# Patient Record
Sex: Male | Born: 1950 | Race: Black or African American | Hispanic: No | Marital: Single | Smoking: Former smoker
Health system: Southern US, Community
[De-identification: ages and names within clinical notes are randomized; demographics above are authoritative.]

## PROBLEM LIST (undated history)

## (undated) DIAGNOSIS — E785 Hyperlipidemia, unspecified: Secondary | ICD-10-CM

## (undated) DIAGNOSIS — N529 Male erectile dysfunction, unspecified: Secondary | ICD-10-CM

## (undated) DIAGNOSIS — N486 Induration penis plastica: Secondary | ICD-10-CM

## (undated) DIAGNOSIS — M25561 Pain in right knee: Secondary | ICD-10-CM

## (undated) DIAGNOSIS — I10 Essential (primary) hypertension: Secondary | ICD-10-CM

## (undated) HISTORY — DX: Male erectile dysfunction, unspecified: N52.9

## (undated) HISTORY — DX: Hyperlipidemia, unspecified: E78.5

## (undated) HISTORY — DX: Pain in right knee: M25.561

## (undated) HISTORY — PX: APPENDECTOMY: SHX54

## (undated) HISTORY — DX: Essential (primary) hypertension: I10

## (undated) HISTORY — DX: Induration penis plastica: N48.6

---

## 2018-01-16 DIAGNOSIS — E7849 Other hyperlipidemia: Secondary | ICD-10-CM | POA: Diagnosis not present

## 2018-01-16 DIAGNOSIS — Z6834 Body mass index (BMI) 34.0-34.9, adult: Secondary | ICD-10-CM | POA: Diagnosis not present

## 2018-01-16 DIAGNOSIS — I10 Essential (primary) hypertension: Secondary | ICD-10-CM | POA: Diagnosis not present

## 2018-07-19 DIAGNOSIS — R82998 Other abnormal findings in urine: Secondary | ICD-10-CM | POA: Diagnosis not present

## 2018-07-19 DIAGNOSIS — I10 Essential (primary) hypertension: Secondary | ICD-10-CM | POA: Diagnosis not present

## 2018-07-19 DIAGNOSIS — Z125 Encounter for screening for malignant neoplasm of prostate: Secondary | ICD-10-CM | POA: Diagnosis not present

## 2018-07-19 DIAGNOSIS — E7849 Other hyperlipidemia: Secondary | ICD-10-CM | POA: Diagnosis not present

## 2018-07-25 DIAGNOSIS — M25561 Pain in right knee: Secondary | ICD-10-CM | POA: Diagnosis not present

## 2018-07-25 DIAGNOSIS — E7849 Other hyperlipidemia: Secondary | ICD-10-CM | POA: Diagnosis not present

## 2018-07-25 DIAGNOSIS — I10 Essential (primary) hypertension: Secondary | ICD-10-CM | POA: Diagnosis not present

## 2018-07-25 DIAGNOSIS — N528 Other male erectile dysfunction: Secondary | ICD-10-CM | POA: Diagnosis not present

## 2018-07-25 DIAGNOSIS — Z0000 Encounter for general adult medical examination without abnormal findings: Secondary | ICD-10-CM | POA: Diagnosis not present

## 2018-07-25 DIAGNOSIS — N486 Induration penis plastica: Secondary | ICD-10-CM | POA: Diagnosis not present

## 2018-07-25 DIAGNOSIS — Z6833 Body mass index (BMI) 33.0-33.9, adult: Secondary | ICD-10-CM | POA: Diagnosis not present

## 2018-07-28 DIAGNOSIS — Z1212 Encounter for screening for malignant neoplasm of rectum: Secondary | ICD-10-CM | POA: Diagnosis not present

## 2018-10-19 DIAGNOSIS — Z6833 Body mass index (BMI) 33.0-33.9, adult: Secondary | ICD-10-CM | POA: Diagnosis not present

## 2018-10-19 DIAGNOSIS — M25461 Effusion, right knee: Secondary | ICD-10-CM | POA: Diagnosis not present

## 2018-10-19 DIAGNOSIS — M25561 Pain in right knee: Secondary | ICD-10-CM | POA: Diagnosis not present

## 2018-10-31 DIAGNOSIS — N5201 Erectile dysfunction due to arterial insufficiency: Secondary | ICD-10-CM | POA: Diagnosis not present

## 2018-10-31 DIAGNOSIS — N486 Induration penis plastica: Secondary | ICD-10-CM | POA: Diagnosis not present

## 2018-11-03 ENCOUNTER — Ambulatory Visit (AMBULATORY_SURGERY_CENTER): Payer: Self-pay

## 2018-11-03 VITALS — Ht 75.0 in | Wt 269.4 lb

## 2018-11-03 DIAGNOSIS — Z1211 Encounter for screening for malignant neoplasm of colon: Secondary | ICD-10-CM

## 2018-11-03 MED ORDER — NA SULFATE-K SULFATE-MG SULF 17.5-3.13-1.6 GM/177ML PO SOLN: 1.0000 | Freq: Once | ORAL | 0 refills | Status: AC

## 2018-11-03 NOTE — Progress Notes (Signed)
Per pt, no allergies to soy or egg products.Pt not taking any weight loss meds or using  O2 at home.  Pt refused emmi video. 

## 2018-11-06 ENCOUNTER — Encounter: Payer: Self-pay | Admitting: Gastroenterology

## 2018-11-13 ENCOUNTER — Ambulatory Visit (AMBULATORY_SURGERY_CENTER): Payer: Medicare HMO | Admitting: Gastroenterology

## 2018-11-13 ENCOUNTER — Encounter: Payer: Self-pay | Admitting: Gastroenterology

## 2018-11-13 VITALS — BP 122/78 | HR 65 | Temp 98.0°F | Resp 36 | Ht 75.0 in | Wt 269.0 lb

## 2018-11-13 DIAGNOSIS — Z1211 Encounter for screening for malignant neoplasm of colon: Secondary | ICD-10-CM | POA: Diagnosis not present

## 2018-11-13 DIAGNOSIS — D123 Benign neoplasm of transverse colon: Secondary | ICD-10-CM | POA: Diagnosis not present

## 2018-11-13 MED ORDER — SODIUM CHLORIDE 0.9 % IV SOLN: 500.0000 mL | Freq: Once | INTRAVENOUS | Status: DC

## 2018-11-13 NOTE — Patient Instructions (Signed)
Handouts: Polyps  YOU HAD AN ENDOSCOPIC PROCEDURE TODAY AT THE [facility]:   Refer to the procedure report that was given to you for any specific questions about what was found during the examination.  If the procedure report does not answer your questions, please call your gastroenterologist to clarify.  If you requested that your care partner not be given the details of your procedure findings, then the procedure report has been included in a sealed envelope for you to review at your convenience later.  YOU SHOULD EXPECT: Some feelings of bloating in the abdomen. Passage of more gas than usual.  Walking can help get rid of the air that was put into your GI tract during the procedure and reduce the bloating. If you had a lower endoscopy (such as a colonoscopy or flexible sigmoidoscopy) you may notice spotting of blood in your stool or on the toilet paper. If you underwent a bowel prep for your procedure, you may not have a normal bowel movement for a few days.  Please Note:  You might notice some irritation and congestion in your nose or some drainage.  This is from the oxygen used during your procedure.  There is no need for concern and it should clear up in a day or so.  SYMPTOMS TO REPORT IMMEDIATELY:   Following lower endoscopy (colonoscopy or flexible sigmoidoscopy):  Excessive amounts of blood in the stool  Significant tenderness or worsening of abdominal pains  Swelling of the abdomen that is new, acute  Fever of 100F or higher  For urgent or emergent issues, a gastroenterologist can be reached at any hour by calling [phone].   DIET:  We do recommend a small meal at first, but then you may proceed to your regular diet.  Drink plenty of fluids but you should avoid alcoholic beverages for 24 hours.  ACTIVITY:  You should plan to take it easy for the rest of today and you should NOT DRIVE or use heavy machinery until tomorrow (because of the sedation medicines used  during the test).    FOLLOW UP: Our staff will call the number listed on your records the next business day following your procedure to check on you and address any questions or concerns that you may have regarding the information given to you following your procedure. If we do not reach you, we will leave a message.  However, if you are feeling well and you are not experiencing any problems, there is no need to return our call.  We will assume that you have returned to your regular daily activities without incident.  If any biopsies were taken you will be contacted by phone or by letter within the next 1-3 weeks.  Please call us at [phone] if you have not heard about the biopsies in 3 weeks.    SIGNATURES/CONFIDENTIALITY: You and/or your care partner have signed paperwork which will be entered into your electronic medical record.  These signatures attest to the fact that that the information above on your After Visit Summary has been reviewed and is understood.  Full responsibility of the confidentiality of this discharge information lies with you and/or your care-partner. 

## 2018-11-13 NOTE — Progress Notes (Signed)
I have reviewed the patient's medical history in detail and updated the computerized patient record.

## 2018-11-13 NOTE — Progress Notes (Signed)
A and O x3. Report to RN. Tolerated MAC anesthesia well.

## 2018-11-13 NOTE — Op Note (Signed)
[place] Patient Name: Jack Perry Procedure Date: 11/13/2018 11:13 AM MRN: [id number] Endoscopist: Mallie Mussel L. Loletha Carrow , MD Age: 68 Referring MD:  Date of Birth: [birth year] Gender: Male Account #: [id number] Procedure:                Colonoscopy Indications:              Screening for colorectal malignant neoplasm, This                            is the patient's first colonoscopy Medicines:                Monitored Anesthesia Care Procedure:                Pre-Anesthesia Assessment:                           - Prior to the procedure, a History and Physical                            was performed, and patient medications and                            allergies were reviewed. The patient's tolerance of                            previous anesthesia was also reviewed. The risks                            and benefits of the procedure and the sedation                            options and risks were discussed with the patient.                            All questions were answered, and informed consent                            was obtained. Prior Anticoagulants: The patient has                            taken no previous anticoagulant or antiplatelet                            agents. ASA Grade Assessment: II - A patient with                            mild systemic disease. After reviewing the risks                            and benefits, the patient was deemed in                            satisfactory condition to undergo the procedure.  After obtaining informed consent, the colonoscope                            was passed under direct vision. Throughout the                            procedure, the patient's blood pressure, pulse, and                            oxygen saturations were monitored continuously. The                            Model CF-HQ190L [id number] scope was introduced                            through the anus and  advanced to the the cecum,                            identified by appendiceal orifice and ileocecal                            valve. The colonoscopy was performed without                            difficulty. The patient tolerated the procedure                            well. The quality of the bowel preparation was                            excellent. The ileocecal valve, appendiceal                            orifice, and rectum were photographed. Scope In: 11:20:31 AM Scope Out: 11:36:37 AM Scope Withdrawal Time: 0 hours 11 minutes 6 seconds  Total Procedure Duration: 0 hours 16 minutes 6 seconds  Findings:                 The perianal and digital rectal examinations were                            normal.                           A 2 mm polyp was found in the mid transverse colon.                            The polyp was sessile. The polyp was removed with a                            cold biopsy forceps. Resection and retrieval were                            complete.  The exam was otherwise without abnormality on                            direct and retroflexion views. Complications:            No immediate complications. Estimated Blood Loss:     Estimated blood loss was minimal. Impression:               - One 2 mm polyp in the mid transverse colon,                            removed with a cold biopsy forceps. Resected and                            retrieved.                           - The examination was otherwise normal on direct                            and retroflexion views. Recommendation:           - Patient has a contact number available for                            emergencies. The signs and symptoms of potential                            delayed complications were discussed with the                            patient. Return to normal activities tomorrow.                            Written discharge instructions were provided  to the                            patient.                           - Resume previous diet.                           - Continue present medications.                           - Await pathology results.                           - Repeat colonoscopy is recommended for                            surveillance. The colonoscopy date will be                            determined after pathology results from today's  exam become available for review. Henry L. Loletha Carrow, MD 11/13/2018 11:40:20 AM This report has been signed electronically.

## 2018-11-14 ENCOUNTER — Telehealth: Payer: Self-pay | Admitting: *Deleted

## 2018-11-14 NOTE — Telephone Encounter (Signed)
No answer, left message to call if questions or concerns. 

## 2018-11-14 NOTE — Telephone Encounter (Signed)
  Follow up Call-  Call back number 11/13/2018  Post procedure Call Back phone  # [phone]  Permission to leave phone message Yes     Patient questions:  Message left to call us if necessary. Second call.

## 2018-11-16 ENCOUNTER — Encounter: Payer: Self-pay | Admitting: Gastroenterology

## 2018-12-18 DIAGNOSIS — M25561 Pain in right knee: Secondary | ICD-10-CM | POA: Diagnosis not present

## 2018-12-18 DIAGNOSIS — M1711 Unilateral primary osteoarthritis, right knee: Secondary | ICD-10-CM | POA: Diagnosis not present

## 2019-01-16 DIAGNOSIS — M170 Bilateral primary osteoarthritis of knee: Secondary | ICD-10-CM | POA: Diagnosis not present

## 2019-01-23 DIAGNOSIS — I10 Essential (primary) hypertension: Secondary | ICD-10-CM | POA: Diagnosis not present

## 2019-01-23 DIAGNOSIS — M25561 Pain in right knee: Secondary | ICD-10-CM | POA: Diagnosis not present

## 2019-01-23 DIAGNOSIS — R69 Illness, unspecified: Secondary | ICD-10-CM | POA: Diagnosis not present

## 2019-01-23 DIAGNOSIS — E785 Hyperlipidemia, unspecified: Secondary | ICD-10-CM | POA: Diagnosis not present

## 2019-01-23 DIAGNOSIS — Z1331 Encounter for screening for depression: Secondary | ICD-10-CM | POA: Diagnosis not present

## 2019-01-23 DIAGNOSIS — N529 Male erectile dysfunction, unspecified: Secondary | ICD-10-CM | POA: Diagnosis not present

## 2019-01-31 DIAGNOSIS — N5201 Erectile dysfunction due to arterial insufficiency: Secondary | ICD-10-CM | POA: Diagnosis not present

## 2019-02-08 ENCOUNTER — Observation Stay (HOSPITAL_COMMUNITY)
Admission: EM | Admit: 2019-02-08 | Discharge: 2019-02-10 | Disposition: A | Discharge status: Home / Self Care | Payer: Medicare HMO | Attending: Surgery | Admitting: Surgery

## 2019-02-08 ENCOUNTER — Encounter (HOSPITAL_COMMUNITY): Payer: Self-pay | Admitting: Emergency Medicine

## 2019-02-08 ENCOUNTER — Emergency Department (HOSPITAL_COMMUNITY): Payer: Medicare HMO

## 2019-02-08 ENCOUNTER — Other Ambulatory Visit: Payer: Self-pay

## 2019-02-08 DIAGNOSIS — K819 Cholecystitis, unspecified: Secondary | ICD-10-CM | POA: Diagnosis present

## 2019-02-08 DIAGNOSIS — D72820 Lymphocytosis (symptomatic): Secondary | ICD-10-CM | POA: Diagnosis not present

## 2019-02-08 DIAGNOSIS — R52 Pain, unspecified: Secondary | ICD-10-CM

## 2019-02-08 DIAGNOSIS — R69 Illness, unspecified: Secondary | ICD-10-CM | POA: Diagnosis not present

## 2019-02-08 DIAGNOSIS — K810 Acute cholecystitis: Secondary | ICD-10-CM | POA: Diagnosis not present

## 2019-02-08 DIAGNOSIS — K219 Gastro-esophageal reflux disease without esophagitis: Secondary | ICD-10-CM | POA: Insufficient documentation

## 2019-02-08 DIAGNOSIS — K8020 Calculus of gallbladder without cholecystitis without obstruction: Secondary | ICD-10-CM | POA: Diagnosis not present

## 2019-02-08 DIAGNOSIS — R109 Unspecified abdominal pain: Secondary | ICD-10-CM | POA: Diagnosis not present

## 2019-02-08 DIAGNOSIS — K8010 Calculus of gallbladder with chronic cholecystitis without obstruction: Principal | ICD-10-CM | POA: Insufficient documentation

## 2019-02-08 DIAGNOSIS — K829 Disease of gallbladder, unspecified: Secondary | ICD-10-CM | POA: Diagnosis not present

## 2019-02-08 DIAGNOSIS — Z419 Encounter for procedure for purposes other than remedying health state, unspecified: Secondary | ICD-10-CM

## 2019-02-08 LAB — CBC WITH DIFFERENTIAL/PLATELET
Abs Immature Granulocytes: 0.01 10*3/uL (ref 0.00–0.07)
Basophils Absolute: 0 10*3/uL (ref 0.0–0.1)
Basophils Relative: 0 %
Eosinophils Absolute: 0.1 10*3/uL (ref 0.0–0.5)
Eosinophils Relative: 1 %
HCT: 50.3 % (ref 39.0–52.0)
Hemoglobin: 16 g/dL (ref 13.0–17.0)
Immature Granulocytes: 0 %
Lymphocytes Relative: 66 %
Lymphs Abs: 7.2 10*3/uL — ABNORMAL HIGH (ref 0.7–4.0)
MCH: 30.3 pg (ref 26.0–34.0)
MCHC: 31.8 g/dL (ref 30.0–36.0)
MCV: 95.3 fL (ref 80.0–100.0)
Monocytes Absolute: 0.5 10*3/uL (ref 0.1–1.0)
Monocytes Relative: 5 %
Neutro Abs: 3 10*3/uL (ref 1.7–7.7)
Neutrophils Relative %: 28 %
Platelets: 172 10*3/uL (ref 150–400)
RBC: 5.28 MIL/uL (ref 4.22–5.81)
RDW: 13.3 % (ref 11.5–15.5)
WBC: 10.7 10*3/uL — ABNORMAL HIGH (ref 4.0–10.5)
nRBC: 0 % (ref 0.0–0.2)

## 2019-02-08 LAB — URINALYSIS, ROUTINE W REFLEX MICROSCOPIC
Bacteria, UA: NONE SEEN
Bilirubin Urine: NEGATIVE
Glucose, UA: NEGATIVE mg/dL
Ketones, ur: NEGATIVE mg/dL
Leukocytes,Ua: NEGATIVE
Nitrite: NEGATIVE
Protein, ur: NEGATIVE mg/dL
Specific Gravity, Urine: 1.017 (ref 1.005–1.030)
pH: 5 (ref 5.0–8.0)

## 2019-02-08 LAB — COMPREHENSIVE METABOLIC PANEL
ALT: 14 U/L (ref 0–44)
AST: 23 U/L (ref 15–41)
Albumin: 4.4 g/dL (ref 3.5–5.0)
Alkaline Phosphatase: 71 U/L (ref 38–126)
Anion gap: 9 (ref 5–15)
BUN: 16 mg/dL (ref 8–23)
CO2: 25 mmol/L (ref 22–32)
Calcium: 9.4 mg/dL (ref 8.9–10.3)
Chloride: 104 mmol/L (ref 98–111)
Creatinine, Ser: 0.94 mg/dL (ref 0.61–1.24)
GFR calc Af Amer: >60 mL/min (ref >60)
GFR calc non Af Amer: >60 mL/min (ref >60)
Glucose, Bld: 93 mg/dL (ref 70–99)
Potassium: 4.2 mmol/L (ref 3.5–5.1)
Sodium: 138 mmol/L (ref 135–145)
Total Bilirubin: 0.9 mg/dL (ref 0.3–1.2)
Total Protein: 7.7 g/dL (ref 6.5–8.1)

## 2019-02-08 LAB — LIPASE, BLOOD: Lipase: 28 U/L (ref 11–51)

## 2019-02-08 MED ORDER — FENTANYL CITRATE (PF) 100 MCG/2ML IJ SOLN
50.0000 ug | Freq: Once | INTRAMUSCULAR | Status: AC
  Administered 2019-02-08: 50 ug via INTRAVENOUS
  Filled 2019-02-08: qty 2

## 2019-02-08 MED ORDER — ENOXAPARIN SODIUM 40 MG/0.4ML ~~LOC~~ SOLN
40.0000 mg | Freq: Every day | SUBCUTANEOUS | Status: DC
  Administered 2019-02-09: 40 mg via SUBCUTANEOUS
  Filled 2019-02-08 (×2): qty 0.4

## 2019-02-08 MED ORDER — IOHEXOL 300 MG/ML  SOLN
100.0000 mL | Freq: Once | INTRAMUSCULAR | Status: AC | PRN
  Administered 2019-02-08: 100 mL via INTRAVENOUS

## 2019-02-08 MED ORDER — SODIUM CHLORIDE 0.9 % IV SOLN
INTRAVENOUS | Status: DC
  Administered 2019-02-08 – 2019-02-09 (×2): via INTRAVENOUS

## 2019-02-08 MED ORDER — SODIUM CHLORIDE (PF) 0.9 % IJ SOLN
INTRAMUSCULAR | Status: AC
  Filled 2019-02-08: qty 50

## 2019-02-08 MED ORDER — DIPHENHYDRAMINE HCL 12.5 MG/5ML PO ELIX: 12.5000 mg | ORAL_SOLUTION | Freq: Four times a day (QID) | ORAL | Status: DC | PRN

## 2019-02-08 MED ORDER — HYDROMORPHONE HCL 1 MG/ML IJ SOLN
0.5000 mg | INTRAMUSCULAR | Status: DC | PRN
  Administered 2019-02-08 – 2019-02-09 (×3): 0.5 mg via INTRAVENOUS
  Filled 2019-02-08 (×3): qty 0.5

## 2019-02-08 MED ORDER — SODIUM CHLORIDE 0.9 % IV SOLN
2.0000 g | Freq: Every day | INTRAVENOUS | Status: DC
  Administered 2019-02-09: 01:00:00 2 g via INTRAVENOUS
  Filled 2019-02-08: qty 2
  Filled 2019-02-08: qty 20

## 2019-02-08 MED ORDER — ONDANSETRON 4 MG PO TBDP: 4.0000 mg | ORAL_TABLET | Freq: Four times a day (QID) | ORAL | Status: DC | PRN

## 2019-02-08 MED ORDER — DOCUSATE SODIUM 100 MG PO CAPS
100.0000 mg | ORAL_CAPSULE | Freq: Two times a day (BID) | ORAL | Status: DC
  Administered 2019-02-09 – 2019-02-10 (×3): 100 mg via ORAL
  Filled 2019-02-08 (×4): qty 1

## 2019-02-08 MED ORDER — ONDANSETRON HCL 4 MG/2ML IJ SOLN: 4.0000 mg | Freq: Four times a day (QID) | INTRAMUSCULAR | Status: DC | PRN

## 2019-02-08 MED ORDER — DIPHENHYDRAMINE HCL 50 MG/ML IJ SOLN: 12.5000 mg | Freq: Four times a day (QID) | INTRAMUSCULAR | Status: DC | PRN

## 2019-02-08 MED ORDER — ONDANSETRON HCL 4 MG/2ML IJ SOLN
4.0000 mg | Freq: Once | INTRAMUSCULAR | Status: AC
  Administered 2019-02-08: 4 mg via INTRAVENOUS
  Filled 2019-02-08: qty 2

## 2019-02-08 NOTE — ED Provider Notes (Signed)
[facility] DEPT Provider Note   CSN: [id number] Arrival date & time: 02/08/19  1602    History   Chief Complaint Chief Complaint  Patient presents with   Abdominal Pain    HPI Jack Perry is a 68 y.o. male.     HPI Patient presents with concern of abdominal pain, anorexia, nausea. Onset was greater than 1 month ago, and symptoms have been persistent, with pain focally about the epigastrium, with some radiation throughout the infracostal region. Pain is sore, severe, currently 10/10. Patient had a colonoscopy about the time of onset of symptoms. He notes that since that time he has had inconsistent bowel movements as well as the aforementioned pain, nausea. He has spoken with his physician, started a new medication 4 days ago. He notes that in spite of his medication, which he is unaware of the name, he has had persistent pain, discomfort persistently. No fever, no chest pain, no dyspnea.  Patient states that he is generally well. History reviewed. No pertinent past medical history.  There are no active problems to display for this patient.   Past Surgical History:  Procedure Laterality Date   APPENDECTOMY  1970's        Home Medications    Prior to Admission medications   Not on File    Family History Family History  Problem Relation Age of Onset   Lung cancer Father    Healthy Sister    Heart disease Brother     Social History Social History   Tobacco Use   Smoking status: Never Smoker   Smokeless tobacco: Never Used  Substance Use Topics   Alcohol use: Not Currently   Drug use: Not Currently     Allergies   Patient has no known allergies.   Review of Systems Review of Systems  Constitutional:       Per HPI, otherwise negative  HENT:       Per HPI, otherwise negative  Respiratory:       Per HPI, otherwise negative  Cardiovascular:       Per HPI, otherwise negative  Gastrointestinal: Positive  for abdominal pain and nausea. Negative for vomiting.  Endocrine:       Negative aside from HPI  Genitourinary:       Neg aside from HPI   Musculoskeletal:       Per HPI, otherwise negative  Skin: Negative.   Neurological: Negative for syncope.     Physical Exam Updated Vital Signs BP (!) 174/95 (BP Location: Left Arm) Comment: Simultaneous filing. User may not have seen previous data.   Pulse 84 Comment: Simultaneous filing. User may not have seen previous data.   Temp 99.2 F (37.3 C) (Oral)    Resp 18    SpO2 99% Comment: Simultaneous filing. User may not have seen previous data.  Physical Exam Vitals signs and nursing note reviewed.  Constitutional:      General: He is not in acute distress.    Appearance: He is well-developed.  HENT:     Head: Normocephalic and atraumatic.  Eyes:     Conjunctiva/sclera: Conjunctivae normal.  Cardiovascular:     Rate and Rhythm: Normal rate and regular rhythm.  Pulmonary:     Effort: Pulmonary effort is normal. No respiratory distress.     Breath sounds: No stridor.  Abdominal:     General: There is no distension.     Tenderness: There is abdominal tenderness in the epigastric area.  Comments: Mild tenderness about the epigastrium, no peritoneal findings  Skin:    General: Skin is warm and dry.  Neurological:     Mental Status: He is alert and oriented to person, place, and time.      ED Treatments / Results  Labs (all labs ordered are listed, but only abnormal results are displayed) Labs Reviewed  CBC WITH DIFFERENTIAL/PLATELET - Abnormal; Notable for the following components:      Result Value   WBC 10.7 (*)    Lymphs Abs 7.2 (*)    All other components within normal limits  URINALYSIS, ROUTINE W REFLEX MICROSCOPIC - Abnormal; Notable for the following components:   Hgb urine dipstick SMALL (*)    All other components within normal limits  COMPREHENSIVE METABOLIC PANEL  LIPASE, BLOOD  PATHOLOGIST SMEAR REVIEW     EKG None  Radiology Ct Abdomen Pelvis W Contrast  Result Date: 02/08/2019 CLINICAL DATA:  Patient c/o constant sharp epigastric pain x1 month. Reports noticed pain started after colonoscopy. Denies N/V/D. States pain worsens with eating EXAM: CT ABDOMEN AND PELVIS WITH CONTRAST TECHNIQUE: Multidetector CT imaging of the abdomen and pelvis was performed using the standard protocol following bolus administration of intravenous contrast. CONTRAST:  147mL OMNIPAQUE IOHEXOL 300 MG/ML  SOLN COMPARISON:  None. FINDINGS: Lower chest: No acute abnormality. Hepatobiliary: No focal liver abnormality is seen. Distended gallbladder with gallbladder wall thickening and a small amount of pericholecystic fluid. Mild inflammatory changes around the gallbladder. Pancreas: Unremarkable. No pancreatic ductal dilatation or surrounding inflammatory changes. Spleen: Normal in size without focal abnormality. Adrenals/Urinary Tract: Adrenal glands are unremarkable. Kidneys are normal, without renal calculi, focal lesion, or hydronephrosis. Bladder is unremarkable. Stomach/Bowel: Stomach is within normal limits. Appendix appears normal. No evidence of bowel wall thickening, distention, or inflammatory changes. Vascular/Lymphatic: No significant vascular findings are present. No enlarged abdominal or pelvic lymph nodes. Reproductive: Prostate is unremarkable. Other: No abdominal wall hernia or abnormality. No abdominopelvic ascites. Musculoskeletal: No acute or significant osseous findings. 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 IMPRESSION: 1. Distended gallbladder with mild pericholecystic fluid, gallbladder wall thickening and mild surrounding inflammatory changes. Overall appearance is most concerning for acute cholecystitis. Recommend further evaluation with a right upper quadrant ultrasound for evaluation cholelithiasis. Electronically Signed   By: Kathreen Devoid   On: 02/08/2019  18:31   US Abdomen Limited Ruq  Result Date: 02/08/2019 CLINICAL DATA:  Right upper quadrant abdominal pain. Symptoms for 1 month. Abnormal appearance gallbladder on preceding CT. EXAM: ULTRASOUND ABDOMEN LIMITED RIGHT UPPER QUADRANT COMPARISON:  CT same date. FINDINGS: Gallbladder: There is curvilinear echogenicity in the gallbladder fundus, without corresponding calcification or air on preceding CT. Several gallstones are present as well as sludge. There is gallbladder wall thickening to 5 mm and mild pericholecystic fluid. Per the sonographer, there is a positive sonographic Murphy's sign. Common bile duct: Diameter: 6 mm Liver: No focal lesion identified. Within normal limits in parenchymal echogenicity. Portal vein is patent on color Doppler imaging with normal direction of blood flow towards the liver. IMPRESSION: 1. Cholelithiasis, gallbladder wall thickening and positive sonographic Murphy sign consistent with acute cholecystitis as correlated with preceding CT. 2. No biliary dilatation. Electronically Signed   By: Richardean Sale M.D.   On: 02/08/2019 20:06    Procedures Procedures (including critical care time)  Medications Ordered in ED Medications  0.9 %  sodium chloride infusion ( Intravenous New Bag/Given 02/08/19 1659)  sodium chloride (PF) 0.9 %  injection (has no administration in time range)  fentaNYL (SUBLIMAZE) injection 50 mcg (50 mcg Intravenous Given 02/08/19 1721)  ondansetron (ZOFRAN) injection 4 mg (4 mg Intravenous Given 02/08/19 1719)  iohexol (OMNIPAQUE) 300 MG/ML solution 100 mL (100 mLs Intravenous Contrast Given 02/08/19 1814)     Initial Impression / Assessment and Plan / ED Course  I have reviewed the triage vital signs and the nursing notes.  Pertinent labs & imaging results that were available during my care of the patient were reviewed by me and considered in my medical decision making (see chart for details).    Colonoscopy results from January, 2020  reviewed: Findings: - The perianal and digital rectal examinations were normal. - A 2 mm polyp was found in the mid transverse colon. The polyp was sessile. The polyp was removed with a cold biopsy forceps. Resection and retrieval were complete. - The exam was otherwise without abnormality on direct and retroflexion views.    Patient aware of all findings.   I discussed patient's case with our general surgeon, Dr. Marcello Moores. [place] requested  8:20 PM Patient aware of all findings, consistent with acute cholecystitis peer He is awake and alert, sitting upright, hemodynamically unremarkable Patient appearing for admission for surgical evaluation.  Final Clinical Impressions(s) / ED Diagnoses   Final diagnoses:  Pain  Acute cholecystitis      Carmin Muskrat, MD 02/08/19 2021

## 2019-02-08 NOTE — ED Notes (Signed)
Called to give report, nurse unable to take report at this time.

## 2019-02-08 NOTE — ED Notes (Signed)
ED TO INPATIENT HANDOFF REPORT  ED Nurse Name and Phone #: Earnest Bailey RN [phone]  S Name/Age/Gender [place] 68 y.o. male Room/Bed: WA13/WA13  Code Status   Code Status: Not on file  Home/SNF/Other Home Patient oriented to: self Is this baseline? Yes   Triage Complete: Triage complete  Chief Complaint abd pain  Triage Note Patient c/o constant sharp epigastric pain x1 month. Reports noticed pain started after colonoscopy. Denies N/V/D. States pain worsens with eating.   Allergies No Known Allergies  Level of Care/Admitting Diagnosis ED Disposition    ED Disposition Condition Comment   Admit  Hospital Area: [facility] [[id number]]  Level of Care: Med-Surg [16]  Covid Evaluation: N/A  Diagnosis: Cholecystitis [409735]  Admitting Physician: CCS, Oakes  Attending Physician: CCS, MD [[id number]  PT Class (Do Not Modify): Observation [104]  PT Acc Code (Do Not Modify): Observation [10022]       B Medical/Surgery History History reviewed. No pertinent past medical history. Past Surgical History:  Procedure Laterality Date  . APPENDECTOMY  1970's     A IV Location/Drains/Wounds Patient Lines/Drains/Airways Status   Active Line/Drains/Airways    Name:   Placement date:   Placement time:   Site:   Days:   Peripheral IV 02/08/19 Left Antecubital   02/08/19    1645    Antecubital   less than 1          Intake/Output Last 24 hours No intake or output data in the 24 hours ending 02/08/19 2311  Labs/Imaging Results for orders placed or performed during the hospital encounter of 02/08/19 (from the past 48 hour(s))  Comprehensive metabolic panel     Status: None   Collection Time: 02/08/19  4:55 PM  Result Value Ref Range   Sodium 138 135 - 145 mmol/L   Potassium 4.2 3.5 - 5.1 mmol/L   Chloride 104 98 - 111 mmol/L   CO2 25 22 - 32 mmol/L   Glucose, Bld 93 70 - 99 mg/dL   BUN 16 8 - 23 mg/dL   Creatinine, Ser 0.94 0.61 - 1.24 mg/dL   Calcium  9.4 8.9 - 10.3 mg/dL   Total Protein 7.7 6.5 - 8.1 g/dL   Albumin 4.4 3.5 - 5.0 g/dL   AST 23 15 - 41 U/L   ALT 14 0 - 44 U/L   Alkaline Phosphatase 71 38 - 126 U/L   Total Bilirubin 0.9 0.3 - 1.2 mg/dL   GFR calc non Af Amer >60 >60 mL/min   GFR calc Af Amer >60 >60 mL/min   Anion gap 9 5 - 15    Comment: Performed at [facility], [place] [address], [place], [place] [address]  Lipase, blood     Status: None   Collection Time: 02/08/19  4:55 PM  Result Value Ref Range   Lipase 28 11 - 51 U/L    Comment: Performed at [facility], [place] [address], [place], [place] [address]  CBC WITH DIFFERENTIAL     Status: Abnormal   Collection Time: 02/08/19  4:55 PM  Result Value Ref Range   WBC 10.7 (H) 4.0 - 10.5 K/uL   RBC 5.28 4.22 - 5.81 MIL/uL   Hemoglobin 16.0 13.0 - 17.0 g/dL   HCT 50.3 39.0 - 52.0 %   MCV 95.3 80.0 - 100.0 fL   MCH 30.3 26.0 - 34.0 pg   MCHC 31.8 30.0 - 36.0 g/dL   RDW 13.3 11.5 -  15.5 %   Platelets 172 150 - 400 K/uL   nRBC 0.0 0.0 - 0.2 %   Neutrophils Relative % 28 %   Neutro Abs 3.0 1.7 - 7.7 K/uL   Lymphocytes Relative 66 %   Lymphs Abs 7.2 (H) 0.7 - 4.0 K/uL   Monocytes Relative 5 %   Monocytes Absolute 0.5 0.1 - 1.0 K/uL   Eosinophils Relative 1 %   Eosinophils Absolute 0.1 0.0 - 0.5 K/uL   Basophils Relative 0 %   Basophils Absolute 0.0 0.0 - 0.1 K/uL   WBC Morphology ABSOLUTE LYMPHOCYTOSIS     Comment: PENDING PATH REVIEW   Immature Granulocytes 0 %   Abs Immature Granulocytes 0.01 0.00 - 0.07 K/uL   Abnormal Lymphocytes Present PRESENT     Comment: Performed at [facility], [place] [address], [place], [place] [address]  Urinalysis, Routine w reflex microscopic     Status: Abnormal   Collection Time: 02/08/19  4:55 PM  Result Value Ref Range   Color, Urine YELLOW YELLOW   APPearance CLEAR CLEAR   Specific Gravity, Urine 1.017 1.005 - 1.030   pH 5.0 5.0 - 8.0   Glucose, UA NEGATIVE NEGATIVE  mg/dL   Hgb urine dipstick SMALL (A) NEGATIVE   Bilirubin Urine NEGATIVE NEGATIVE   Ketones, ur NEGATIVE NEGATIVE mg/dL   Protein, ur NEGATIVE NEGATIVE mg/dL   Nitrite NEGATIVE NEGATIVE   Leukocytes,Ua NEGATIVE NEGATIVE   RBC / HPF 0-5 0 - 5 RBC/hpf   WBC, UA 0-5 0 - 5 WBC/hpf   Bacteria, UA NONE SEEN NONE SEEN   Squamous Epithelial / LPF 0-5 0 - 5   Mucus PRESENT     Comment: Performed at [facility], [place] [address] [place], [place] [address]   Ct Abdomen Pelvis W Contrast  Result Date: 02/08/2019 CLINICAL DATA:  Patient c/o constant sharp epigastric pain x1 month. Reports noticed pain started after colonoscopy. Denies N/V/D. States pain worsens with eating EXAM: CT ABDOMEN AND PELVIS WITH CONTRAST TECHNIQUE: Multidetector CT imaging of the abdomen and pelvis was performed using the standard protocol following bolus administration of intravenous contrast. CONTRAST:  170mL OMNIPAQUE IOHEXOL 300 MG/ML  SOLN COMPARISON:  None. FINDINGS: Lower chest: No acute abnormality. Hepatobiliary: No focal liver abnormality is seen. Distended gallbladder with gallbladder wall thickening and a small amount of pericholecystic fluid. Mild inflammatory changes around the gallbladder. Pancreas: Unremarkable. No pancreatic ductal dilatation or surrounding inflammatory changes. Spleen: Normal in size without focal abnormality. Adrenals/Urinary Tract: Adrenal glands are unremarkable. Kidneys are normal, without renal calculi, focal lesion, or hydronephrosis. Bladder is unremarkable. Stomach/Bowel: Stomach is within normal limits. Appendix appears normal. No evidence of bowel wall thickening, distention, or inflammatory changes. Vascular/Lymphatic: No significant vascular findings are present. No enlarged abdominal or pelvic lymph nodes. Reproductive: Prostate is unremarkable. Other: No abdominal wall hernia or abnormality. No abdominopelvic ascites. Musculoskeletal: No acute or significant osseous  findings. 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 IMPRESSION: 1. Distended gallbladder with mild pericholecystic fluid, gallbladder wall thickening and mild surrounding inflammatory changes. Overall appearance is most concerning for acute cholecystitis. Recommend further evaluation with a right upper quadrant ultrasound for evaluation cholelithiasis. Electronically Signed   By: Kathreen Devoid   On: 02/08/2019 18:31   US Abdomen Limited Ruq  Result Date: 02/08/2019 CLINICAL DATA:  Right upper quadrant abdominal pain. Symptoms for 1 month. Abnormal appearance gallbladder on preceding CT. EXAM: ULTRASOUND ABDOMEN LIMITED RIGHT UPPER QUADRANT  COMPARISON:  CT same date. FINDINGS: Gallbladder: There is curvilinear echogenicity in the gallbladder fundus, without corresponding calcification or air on preceding CT. Several gallstones are present as well as sludge. There is gallbladder wall thickening to 5 mm and mild pericholecystic fluid. Per the sonographer, there is a positive sonographic Murphy's sign. Common bile duct: Diameter: 6 mm Liver: No focal lesion identified. Within normal limits in parenchymal echogenicity. Portal vein is patent on color Doppler imaging with normal direction of blood flow towards the liver. IMPRESSION: 1. Cholelithiasis, gallbladder wall thickening and positive sonographic Murphy sign consistent with acute cholecystitis as correlated with preceding CT. 2. No biliary dilatation. Electronically Signed   By: Richardean Sale M.D.   On: 02/08/2019 20:06    Pending Labs Unresulted Labs (From admission, onward)    Start     Ordered   02/08/19 1655  Pathologist smear review  Once,   R     02/08/19 1655   Signed and Held  HIV antibody (Routine Testing)  Once,   R     Signed and Held          Vitals/Pain Today's Vitals   02/08/19 1830 02/08/19 1930 02/08/19 2030 02/08/19 2302  BP: (!) 158/86 (!) 174/95 (!) 161/100 (!) 177/92  Pulse: 61  84 60 (!) 58  Resp:  18 16 17   Temp:      TempSrc:      SpO2: 100% 99% 98% 99%  PainSc:        Isolation Precautions No active isolations  Medications Medications  0.9 %  sodium chloride infusion ( Intravenous New Bag/Given 02/08/19 1659)  sodium chloride (PF) 0.9 % injection (has no administration in time range)  fentaNYL (SUBLIMAZE) injection 50 mcg (50 mcg Intravenous Given 02/08/19 1721)  ondansetron (ZOFRAN) injection 4 mg (4 mg Intravenous Given 02/08/19 1719)  iohexol (OMNIPAQUE) 300 MG/ML solution 100 mL (100 mLs Intravenous Contrast Given 02/08/19 1814)    Mobility walks Low fall risk   Focused Assessments      R Recommendations: See Admitting Provider Note  Report given to:   Additional Notes:

## 2019-02-08 NOTE — ED Notes (Signed)
Patient transported to CT 

## 2019-02-08 NOTE — Progress Notes (Signed)
Called to receive report for RN admitting pt. No answer after being on hold.

## 2019-02-08 NOTE — ED Triage Notes (Addendum)
Patient c/o constant sharp epigastric pain x1 month. Reports noticed pain started after colonoscopy. Denies N/V/D. States pain worsens with eating.

## 2019-02-08 NOTE — H&P (Signed)
Jack Perry is an 68 y.o. male.   Chief Complaint: epigastric pain HPI: 68 y.o. M who presents to the Ed with sharp epigastric pain after eating for the last 6 weeks.  States that this current episode did not resolve on its own.  Denies nausea or chest pain.  History reviewed. No pertinent past medical history.  Past Surgical History:  Procedure Laterality Date  . APPENDECTOMY  1970's     Family History  Problem Relation Age of Onset  . Lung cancer Father   . Healthy Sister   . Heart disease Brother    Social History:  reports that he has never smoked. He has never used smokeless tobacco. He reports previous alcohol use. He reports previous drug use.  Allergies: No Known Allergies  (Not in a hospital admission)   Results for orders placed or performed during the hospital encounter of 02/08/19 (from the past 48 hour(s))  Comprehensive metabolic panel     Status: None   Collection Time: 02/08/19  4:55 PM  Result Value Ref Range   Sodium 138 135 - 145 mmol/L   Potassium 4.2 3.5 - 5.1 mmol/L   Chloride 104 98 - 111 mmol/L   CO2 25 22 - 32 mmol/L   Glucose, Bld 93 70 - 99 mg/dL   BUN 16 8 - 23 mg/dL   Creatinine, Ser 0.94 0.61 - 1.24 mg/dL   Calcium 9.4 8.9 - 10.3 mg/dL   Total Protein 7.7 6.5 - 8.1 g/dL   Albumin 4.4 3.5 - 5.0 g/dL   AST 23 15 - 41 U/L   ALT 14 0 - 44 U/L   Alkaline Phosphatase 71 38 - 126 U/L   Total Bilirubin 0.9 0.3 - 1.2 mg/dL   GFR calc non Af Amer >60 >60 mL/min   GFR calc Af Amer >60 >60 mL/min   Anion gap 9 5 - 15    Comment: Performed at [facility], [place] [address], [place], [place] [address]  Lipase, blood     Status: None   Collection Time: 02/08/19  4:55 PM  Result Value Ref Range   Lipase 28 11 - 51 U/L    Comment: Performed at [facility], [place] [address], [place], [place] [address]  CBC WITH DIFFERENTIAL     Status: Abnormal   Collection Time: 02/08/19  4:55 PM  Result Value Ref Range   WBC  10.7 (H) 4.0 - 10.5 K/uL   RBC 5.28 4.22 - 5.81 MIL/uL   Hemoglobin 16.0 13.0 - 17.0 g/dL   HCT 50.3 39.0 - 52.0 %   MCV 95.3 80.0 - 100.0 fL   MCH 30.3 26.0 - 34.0 pg   MCHC 31.8 30.0 - 36.0 g/dL   RDW 13.3 11.5 - 15.5 %   Platelets 172 150 - 400 K/uL   nRBC 0.0 0.0 - 0.2 %   Neutrophils Relative % 28 %   Neutro Abs 3.0 1.7 - 7.7 K/uL   Lymphocytes Relative 66 %   Lymphs Abs 7.2 (H) 0.7 - 4.0 K/uL   Monocytes Relative 5 %   Monocytes Absolute 0.5 0.1 - 1.0 K/uL   Eosinophils Relative 1 %   Eosinophils Absolute 0.1 0.0 - 0.5 K/uL   Basophils Relative 0 %   Basophils Absolute 0.0 0.0 - 0.1 K/uL   WBC Morphology ABSOLUTE LYMPHOCYTOSIS     Comment: PENDING PATH REVIEW   Immature Granulocytes 0 %   Abs Immature Granulocytes 0.01 0.00 - 0.07  K/uL   Abnormal Lymphocytes Present PRESENT     Comment: Performed at [facility], [place] [address], [place], [place] [address]  Urinalysis, Routine w reflex microscopic     Status: Abnormal   Collection Time: 02/08/19  4:55 PM  Result Value Ref Range   Color, Urine YELLOW YELLOW   APPearance CLEAR CLEAR   Specific Gravity, Urine 1.017 1.005 - 1.030   pH 5.0 5.0 - 8.0   Glucose, UA NEGATIVE NEGATIVE mg/dL   Hgb urine dipstick SMALL (A) NEGATIVE   Bilirubin Urine NEGATIVE NEGATIVE   Ketones, ur NEGATIVE NEGATIVE mg/dL   Protein, ur NEGATIVE NEGATIVE mg/dL   Nitrite NEGATIVE NEGATIVE   Leukocytes,Ua NEGATIVE NEGATIVE   RBC / HPF 0-5 0 - 5 RBC/hpf   WBC, UA 0-5 0 - 5 WBC/hpf   Bacteria, UA NONE SEEN NONE SEEN   Squamous Epithelial / LPF 0-5 0 - 5   Mucus PRESENT     Comment: Performed at [facility], [place] [address] [place], [place] [address]   Ct Abdomen Pelvis W Contrast  Result Date: 02/08/2019 CLINICAL DATA:  Patient c/o constant sharp epigastric pain x1 month. Reports noticed pain started after colonoscopy. Denies N/V/D. States pain worsens with eating EXAM: CT ABDOMEN AND PELVIS WITH CONTRAST  TECHNIQUE: Multidetector CT imaging of the abdomen and pelvis was performed using the standard protocol following bolus administration of intravenous contrast. CONTRAST:  140mL OMNIPAQUE IOHEXOL 300 MG/ML  SOLN COMPARISON:  None. FINDINGS: Lower chest: No acute abnormality. Hepatobiliary: No focal liver abnormality is seen. Distended gallbladder with gallbladder wall thickening and a small amount of pericholecystic fluid. Mild inflammatory changes around the gallbladder. Pancreas: Unremarkable. No pancreatic ductal dilatation or surrounding inflammatory changes. Spleen: Normal in size without focal abnormality. Adrenals/Urinary Tract: Adrenal glands are unremarkable. Kidneys are normal, without renal calculi, focal lesion, or hydronephrosis. Bladder is unremarkable. Stomach/Bowel: Stomach is within normal limits. Appendix appears normal. No evidence of bowel wall thickening, distention, or inflammatory changes. Vascular/Lymphatic: No significant vascular findings are present. No enlarged abdominal or pelvic lymph nodes. Reproductive: Prostate is unremarkable. Other: No abdominal wall hernia or abnormality. No abdominopelvic ascites. Musculoskeletal: No acute or significant osseous findings. 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 IMPRESSION: 1. Distended gallbladder with mild pericholecystic fluid, gallbladder wall thickening and mild surrounding inflammatory changes. Overall appearance is most concerning for acute cholecystitis. Recommend further evaluation with a right upper quadrant ultrasound for evaluation cholelithiasis. Electronically Signed   By: Kathreen Devoid   On: 02/08/2019 18:31    Review of Systems  Constitutional: Negative for chills and fever.  HENT: Negative for congestion and hearing loss.   Respiratory: Negative for cough and shortness of breath.   Cardiovascular: Negative for chest pain and palpitations.  Gastrointestinal: Positive for  abdominal pain. Negative for nausea and vomiting.  Genitourinary: Negative for dysuria and urgency.  Musculoskeletal: Negative for myalgias.  Neurological: Negative for dizziness and headaches.    Blood pressure (!) 158/86, pulse 61, temperature 99.2 F (37.3 C), temperature source Oral, resp. rate 18, SpO2 100 %. Physical Exam  Constitutional: He is oriented to person, place, and time. He appears well-developed and well-nourished.  HENT:  Head: Normocephalic and atraumatic.  Eyes: Pupils are equal, round, and reactive to light. Conjunctivae and EOM are normal.  Neck: Neck supple.  Cardiovascular: Normal rate and regular rhythm.  Respiratory: Effort normal. No respiratory distress.  GI: Soft. There is abdominal tenderness.  Musculoskeletal:  Normal range of motion.  Neurological: He is alert and oriented to person, place, and time.  Skin: Skin is warm and dry.     Assessment/Plan 68 y.o. M with epigastric pain,relatively  normal LFTs and wbc.  CT shows distended GB.  US shows stones and thickened GB wall.  Pt appears to have cholecystitis.  Will admit and place on IV abx.  Plan for OR tom.    Rosario Adie, MD 0/30/1499, 7:13 PM

## 2019-02-08 NOTE — ED Notes (Signed)
Ultrasound at bedside

## 2019-02-08 NOTE — ED Notes (Signed)
Hospitalist called to make aware of continued hypertension. Pt denies any history.

## 2019-02-09 ENCOUNTER — Observation Stay (HOSPITAL_COMMUNITY): Payer: Medicare HMO

## 2019-02-09 ENCOUNTER — Observation Stay (HOSPITAL_COMMUNITY): Payer: Medicare HMO | Admitting: Anesthesiology

## 2019-02-09 ENCOUNTER — Other Ambulatory Visit: Payer: Self-pay

## 2019-02-09 ENCOUNTER — Encounter (HOSPITAL_COMMUNITY)
Admission: EM | Disposition: A | Discharge status: Home / Self Care | Payer: Self-pay | Source: Home / Self Care | Attending: Emergency Medicine

## 2019-02-09 ENCOUNTER — Encounter (HOSPITAL_COMMUNITY): Payer: Self-pay

## 2019-02-09 DIAGNOSIS — K8010 Calculus of gallbladder with chronic cholecystitis without obstruction: Secondary | ICD-10-CM | POA: Diagnosis not present

## 2019-02-09 DIAGNOSIS — K829 Disease of gallbladder, unspecified: Secondary | ICD-10-CM | POA: Diagnosis not present

## 2019-02-09 DIAGNOSIS — K219 Gastro-esophageal reflux disease without esophagitis: Secondary | ICD-10-CM | POA: Diagnosis not present

## 2019-02-09 HISTORY — PX: CHOLECYSTECTOMY: SHX55

## 2019-02-09 LAB — SURGICAL PCR SCREEN
MRSA, PCR: NEGATIVE
Staphylococcus aureus: NEGATIVE

## 2019-02-09 LAB — PATHOLOGIST SMEAR REVIEW

## 2019-02-09 SURGERY — LAPAROSCOPIC CHOLECYSTECTOMY WITH INTRAOPERATIVE CHOLANGIOGRAM
Anesthesia: General | Site: Abdomen

## 2019-02-09 MED ORDER — LABETALOL HCL 5 MG/ML IV SOLN
20.0000 mg | Freq: Once | INTRAVENOUS | Status: AC
  Administered 2019-02-09: 20 mg via INTRAVENOUS

## 2019-02-09 MED ORDER — LABETALOL HCL 5 MG/ML IV SOLN
INTRAVENOUS | Status: AC
  Filled 2019-02-09: qty 8

## 2019-02-09 MED ORDER — OXYCODONE HCL 5 MG PO TABS: 5.0000 mg | ORAL_TABLET | ORAL | Status: DC | PRN

## 2019-02-09 MED ORDER — LACTATED RINGERS IV SOLN
INTRAVENOUS | Status: DC | PRN
  Administered 2019-02-09 (×2): via INTRAVENOUS

## 2019-02-09 MED ORDER — ACETAMINOPHEN 325 MG PO TABS
650.0000 mg | ORAL_TABLET | Freq: Four times a day (QID) | ORAL | Status: DC | PRN
  Administered 2019-02-09 – 2019-02-10 (×2): 650 mg via ORAL
  Filled 2019-02-09 (×2): qty 2

## 2019-02-09 MED ORDER — LABETALOL HCL 5 MG/ML IV SOLN
30.0000 mg | Freq: Once | INTRAVENOUS | Status: AC
  Administered 2019-02-09: 16:00:00 30 mg via INTRAVENOUS

## 2019-02-09 MED ORDER — METHOCARBAMOL 500 MG PO TABS
500.0000 mg | ORAL_TABLET | Freq: Four times a day (QID) | ORAL | Status: DC | PRN
  Administered 2019-02-09: 500 mg via ORAL
  Filled 2019-02-09: qty 1

## 2019-02-09 MED ORDER — LACTATED RINGERS IR SOLN
Status: DC | PRN
  Administered 2019-02-09: 1000 mL

## 2019-02-09 MED ORDER — LIDOCAINE 2% (20 MG/ML) 5 ML SYRINGE
INTRAMUSCULAR | Status: AC
  Filled 2019-02-09: qty 5

## 2019-02-09 MED ORDER — LIDOCAINE 2% (20 MG/ML) 5 ML SYRINGE
INTRAMUSCULAR | Status: DC | PRN
  Administered 2019-02-09: 80 mg via INTRAVENOUS

## 2019-02-09 MED ORDER — SUCCINYLCHOLINE CHLORIDE 200 MG/10ML IV SOSY
PREFILLED_SYRINGE | INTRAVENOUS | Status: DC | PRN
  Administered 2019-02-09: 160 mg via INTRAVENOUS

## 2019-02-09 MED ORDER — FENTANYL CITRATE (PF) 100 MCG/2ML IJ SOLN
25.0000 ug | INTRAMUSCULAR | Status: DC | PRN
  Administered 2019-02-09 (×2): 50 ug via INTRAVENOUS

## 2019-02-09 MED ORDER — OXYCODONE HCL 5 MG/5ML PO SOLN: 5.0000 mg | Freq: Once | ORAL | Status: DC | PRN

## 2019-02-09 MED ORDER — DEXAMETHASONE SODIUM PHOSPHATE 10 MG/ML IJ SOLN
INTRAMUSCULAR | Status: DC | PRN
  Administered 2019-02-09: 10 mg via INTRAVENOUS

## 2019-02-09 MED ORDER — FENTANYL CITRATE (PF) 100 MCG/2ML IJ SOLN
INTRAMUSCULAR | Status: AC
  Filled 2019-02-09: qty 2

## 2019-02-09 MED ORDER — IOHEXOL 300 MG/ML  SOLN
INTRAMUSCULAR | Status: DC | PRN
  Administered 2019-02-09: 13:00:00 50 mL via INTRAVENOUS

## 2019-02-09 MED ORDER — ONDANSETRON HCL 4 MG/2ML IJ SOLN
INTRAMUSCULAR | Status: AC
  Filled 2019-02-09: qty 2

## 2019-02-09 MED ORDER — FENTANYL CITRATE (PF) 250 MCG/5ML IJ SOLN
INTRAMUSCULAR | Status: DC | PRN
  Administered 2019-02-09 (×2): 100 ug via INTRAVENOUS

## 2019-02-09 MED ORDER — ROCURONIUM BROMIDE 10 MG/ML (PF) SYRINGE
PREFILLED_SYRINGE | INTRAVENOUS | Status: AC
  Filled 2019-02-09: qty 10

## 2019-02-09 MED ORDER — LABETALOL HCL 5 MG/ML IV SOLN
INTRAVENOUS | Status: AC
  Filled 2019-02-09: qty 4

## 2019-02-09 MED ORDER — SODIUM CHLORIDE 0.9 % IV SOLN
INTRAVENOUS | Status: DC
  Administered 2019-02-09: 17:00:00 via INTRAVENOUS

## 2019-02-09 MED ORDER — PROPOFOL 10 MG/ML IV BOLUS
INTRAVENOUS | Status: AC
  Filled 2019-02-09: qty 20

## 2019-02-09 MED ORDER — ROCURONIUM BROMIDE 10 MG/ML (PF) SYRINGE
PREFILLED_SYRINGE | INTRAVENOUS | Status: DC | PRN
  Administered 2019-02-09: 40 mg via INTRAVENOUS

## 2019-02-09 MED ORDER — ACETAMINOPHEN 10 MG/ML IV SOLN: 1000.0000 mg | Freq: Once | INTRAVENOUS | Status: DC | PRN

## 2019-02-09 MED ORDER — MIDAZOLAM HCL 2 MG/2ML IJ SOLN
INTRAMUSCULAR | Status: AC
  Filled 2019-02-09: qty 2

## 2019-02-09 MED ORDER — POLYETHYLENE GLYCOL 3350 17 G PO PACK: 17.0000 g | PACK | Freq: Every day | ORAL | Status: DC | PRN

## 2019-02-09 MED ORDER — 0.9 % SODIUM CHLORIDE (POUR BTL) OPTIME
TOPICAL | Status: DC | PRN
  Administered 2019-02-09: 1000 mL

## 2019-02-09 MED ORDER — DEXAMETHASONE SODIUM PHOSPHATE 10 MG/ML IJ SOLN
INTRAMUSCULAR | Status: AC
  Filled 2019-02-09: qty 1

## 2019-02-09 MED ORDER — BISACODYL 10 MG RE SUPP: 10.0000 mg | Freq: Every day | RECTAL | Status: DC | PRN

## 2019-02-09 MED ORDER — PROPOFOL 10 MG/ML IV BOLUS
INTRAVENOUS | Status: DC | PRN
  Administered 2019-02-09: 200 mg via INTRAVENOUS

## 2019-02-09 MED ORDER — OXYCODONE HCL 5 MG PO TABS: 5.0000 mg | ORAL_TABLET | Freq: Once | ORAL | Status: DC | PRN

## 2019-02-09 MED ORDER — ONDANSETRON HCL 4 MG/2ML IJ SOLN
INTRAMUSCULAR | Status: DC | PRN
  Administered 2019-02-09: 4 mg via INTRAVENOUS

## 2019-02-09 MED ORDER — SUGAMMADEX SODIUM 200 MG/2ML IV SOLN
INTRAVENOUS | Status: DC | PRN
  Administered 2019-02-09: 200 mg via INTRAVENOUS

## 2019-02-09 MED ORDER — MIDAZOLAM HCL 5 MG/5ML IJ SOLN
INTRAMUSCULAR | Status: DC | PRN
  Administered 2019-02-09: 2 mg via INTRAVENOUS

## 2019-02-09 MED ORDER — SUCCINYLCHOLINE CHLORIDE 200 MG/10ML IV SOSY
PREFILLED_SYRINGE | INTRAVENOUS | Status: AC
  Filled 2019-02-09: qty 10

## 2019-02-09 MED ORDER — PROMETHAZINE HCL 25 MG/ML IJ SOLN: 6.2500 mg | INTRAMUSCULAR | Status: DC | PRN

## 2019-02-09 MED ORDER — LABETALOL HCL 5 MG/ML IV SOLN
INTRAVENOUS | Status: DC | PRN
  Administered 2019-02-09 (×4): 10 mg via INTRAVENOUS

## 2019-02-09 MED ORDER — LABETALOL HCL 5 MG/ML IV SOLN
30.0000 mg | Freq: Once | INTRAVENOUS | Status: AC
  Administered 2019-02-09: 30 mg via INTRAVENOUS

## 2019-02-09 MED ORDER — KETOROLAC TROMETHAMINE 30 MG/ML IJ SOLN
INTRAMUSCULAR | Status: AC
  Filled 2019-02-09: qty 1

## 2019-02-09 MED ORDER — LABETALOL HCL 5 MG/ML IV SOLN
20.0000 mg | Freq: Once | INTRAVENOUS | Status: AC
  Administered 2019-02-09: 15:00:00 20 mg via INTRAVENOUS

## 2019-02-09 MED ORDER — BUPIVACAINE-EPINEPHRINE 0.5% -1:200000 IJ SOLN
INTRAMUSCULAR | Status: AC
  Filled 2019-02-09: qty 1

## 2019-02-09 MED ORDER — BUPIVACAINE-EPINEPHRINE 0.5% -1:200000 IJ SOLN
INTRAMUSCULAR | Status: DC | PRN
  Administered 2019-02-09: 50 mL

## 2019-02-09 SURGICAL SUPPLY — 44 items
APPLIER CLIP ROT 10 11.4 M/L (STAPLE) ×2
CABLE HIGH FREQUENCY MONO STRZ (ELECTRODE) ×2 IMPLANT
CATH REDDICK CHOLANGI 4FR 50CM (CATHETERS) ×1 IMPLANT
CHLORAPREP W/TINT 26 (MISCELLANEOUS) ×3 IMPLANT
CLIP APPLIE ROT 10 11.4 M/L (STAPLE) ×1 IMPLANT
COVER MAYO STAND STRL (DRAPES) ×2 IMPLANT
COVER SURGICAL LIGHT HANDLE (MISCELLANEOUS) ×2 IMPLANT
COVER WAND RF STERILE (DRAPES) ×1 IMPLANT
DECANTER SPIKE VIAL GLASS SM (MISCELLANEOUS) ×2 IMPLANT
DERMABOND ADVANCED (GAUZE/BANDAGES/DRESSINGS) ×1
DERMABOND ADVANCED .7 DNX12 (GAUZE/BANDAGES/DRESSINGS) IMPLANT
DRAIN CHANNEL 19F RND (DRAIN) ×1 IMPLANT
DRAPE C-ARM 42X120 X-RAY (DRAPES) ×2 IMPLANT
DRSG TEGADERM 4X4.75 (GAUZE/BANDAGES/DRESSINGS) ×1 IMPLANT
ELECT REM PT RETURN 15FT ADLT (MISCELLANEOUS) ×2 IMPLANT
ENDOLOOP SUT PDS II  0 18 (SUTURE) ×2
ENDOLOOP SUT PDS II 0 18 (SUTURE) IMPLANT
EVACUATOR SILICONE 100CC (DRAIN) ×1 IMPLANT
GAUZE SPONGE 2X2 8PLY STRL LF (GAUZE/BANDAGES/DRESSINGS) ×1 IMPLANT
GLOVE SURG ORTHO 8.0 STRL STRW (GLOVE) ×2 IMPLANT
GOWN STRL REUS W/TWL XL LVL3 (GOWN DISPOSABLE) ×6 IMPLANT
HEMOSTAT SNOW SURGICEL 2X4 (HEMOSTASIS) ×1 IMPLANT
HEMOSTAT SURGICEL 4X8 (HEMOSTASIS) IMPLANT
IV CATH 14GX2 1/4 (CATHETERS) ×1 IMPLANT
KIT BASIN OR (CUSTOM PROCEDURE TRAY) ×2 IMPLANT
KIT TURNOVER KIT A (KITS) IMPLANT
POUCH SPECIMEN RETRIEVAL 10MM (ENDOMECHANICALS) ×2 IMPLANT
SCISSORS LAP 5X35 DISP (ENDOMECHANICALS) ×2 IMPLANT
SET CHOLANGIOGRAPH MIX (MISCELLANEOUS) ×2 IMPLANT
SET IRRIG TUBING LAPAROSCOPIC (IRRIGATION / IRRIGATOR) ×2 IMPLANT
SET TUBE SMOKE EVAC HIGH FLOW (TUBING) ×1 IMPLANT
SLEEVE XCEL OPT CAN 5 100 (ENDOMECHANICALS) ×2 IMPLANT
SPONGE DRAIN TRACH 4X4 STRL 2S (GAUZE/BANDAGES/DRESSINGS) ×1 IMPLANT
SPONGE GAUZE 2X2 STER 10/PKG (GAUZE/BANDAGES/DRESSINGS) ×1
STRIP CLOSURE SKIN 1/2X4 (GAUZE/BANDAGES/DRESSINGS) ×1 IMPLANT
SUT ETHILON 2 0 PS N (SUTURE) ×1 IMPLANT
SUT MNCRL AB 4-0 PS2 18 (SUTURE) ×2 IMPLANT
SUT VICRYL 0 UR6 27IN ABS (SUTURE) ×1 IMPLANT
TOWEL OR 17X26 10 PK STRL BLUE (TOWEL DISPOSABLE) ×2 IMPLANT
TOWEL OR NON WOVEN STRL DISP B (DISPOSABLE) ×2 IMPLANT
TRAY LAPAROSCOPIC (CUSTOM PROCEDURE TRAY) ×2 IMPLANT
TROCAR BLADELESS OPT 5 100 (ENDOMECHANICALS) ×2 IMPLANT
TROCAR XCEL BLUNT TIP 100MML (ENDOMECHANICALS) ×2 IMPLANT
TROCAR XCEL NON-BLD 11X100MML (ENDOMECHANICALS) ×2 IMPLANT

## 2019-02-09 NOTE — Op Note (Signed)
Operative Note  Pre-operative Diagnosis:  Subacute cholecystitis, cholelithiasis  Post-operative Diagnosis:  same  Surgeon:  Armandina Gemma, MD  Assistant:  Neysa Bonito, MD   Procedure:  Laparoscopic cholecystectomy with IOC  Anesthesia:  general  Estimated Blood Loss:  150cc  Drains: 19Fr Blake drain to subhepatic space         Specimen: gallbladder to pathology  Indications:  Patient is a 68 yo BM admitted with abdominal pain intermittently for 6 weeks.  Now with more significant pain.  Ultrasound demonstrated a thick-walled gallbladder containing gallstones.  Patient is prepared and brought to the operating room for cholecystectomy.  Procedure Details:  The patient was seen in the pre-op holding area. The risks, benefits, complications, treatment options, and expected outcomes were previously discussed with the patient. The patient agreed with the proposed plan and has signed the informed consent form.  The patient was brought to the operating room by the surgical team, identified as Jack Perry and the procedure verified. A "time out" was completed and the above information confirmed.  Following administration of general endotracheal anesthesia, the patient is positioned and then prepped and draped in the usual aseptic fashion.  After ascertaining that an adequate level of anesthesia been achieved, an incision is made below the umbilicus in the midline.  Fascia is incised in the midline and the peritoneal cavity is entered cautiously.  A 0 Vicryl pursestring suture was placed in the fascia.  An Hassan cannula was introduced under direct vision and secured with the pursestring suture.  Abdomen is insufflated with carbon dioxide.  Laparoscope was introduced and the gallbladder was visualized in the right upper quadrant.  Operative ports were placed along the right costal margin in the midline, midclavicular line, and anterior axillary line.  Omentum is mobilized off of the gallbladder using  the electrocautery for hemostasis.  Gallbladder is markedly distended and thick-walled.  It required aspiration.  Gallbladder was then grasped and retracted cephalad.  Additional adhesions of the omentum were taken down bluntly.  Dissection was carried down to the neck of the gallbladder.  Gallbladder was markedly thickened.  Peritoneal planes were obscured.  With some difficulty the neck of the gallbladder is dissected out down to what appears to be the proximal cystic duct.  The distal neck of the gallbladder is incised.  Cloudy dark fluid emanates from the distal gallbladder.  A Reddick cholangiography catheter is brought on the field and prepared.  It is introduced into the peritoneal cavity with an Angiocath in the right upper quadrant.  Catheter is directed into the distal gallbladder through the incision which we have made with the scissors.  It is directed into the proximal cystic duct.  The balloon is inflated.  Using C-arm fluoroscopy, an attempt is made at cholangiography.  Contrast flows into what appears to be a spiral cystic duct but there appears to be cystic duct obstruction in the common bile duct is not visualized.  There is significant leakage of contrast which limits the value of the study.  Reddick catheter is withdrawn from the peritoneal cavity.  The distal neck of the gallbladder at the junction with the cystic duct is then transected sharply with the scissors.  Gallbladder is then dissected out of the gallbladder bed using a hook and spatula electrocautery's for hemostasis.  Entire gallbladder is excised and placed into an Endo Catch bag and withdrawn through the umbilical port.  It is submitted to pathology for review.  The proximal cystic duct is then occluded  using a 0 PDS Endoloop.  2 of these Endoloops are applied to the cystic duct stump.  There is no evidence of leakage.  Right upper quadrant is irrigated with warm saline and good hemostasis is achieved in the gallbladder bed.   A sheet of hemostatic snow is placed in the gallbladder bed.  Finally a 84 [place] Blake drain is brought in through 1 of the lateral port site and placed in the subhepatic space.  Drain is secured to the skin with a 3-0 nylon suture.  Pneumoperitoneum is released and the abdomen is deflated.  Port sites are anesthetized with local anesthetic.  Skin incisions are closed with interrupted 4-0 Monocryl subcuticular sutures.  The umbilical fascial incision is closed with the pursestring suture and an additional 0 Vicryl suture.  Wounds are washed and dried and Dermabond are applied to the incisions.  Patient is awakened from anesthesia and brought to the recovery room.  The patient tolerated the procedure well.   Armandina Gemma, MD [facility] Office: [phone]

## 2019-02-09 NOTE — Transfer of Care (Signed)
Immediate Anesthesia Transfer of Care Note  Patient: Jack Perry  Procedure(s) Performed: LAPAROSCOPIC CHOLECYSTECTOMY WITH INTRAOPERATIVE CHOLANGIOGRAM (N/A Abdomen)  Patient Location: PACU  Anesthesia Type:General  Level of Consciousness: awake, alert , oriented and patient cooperative  Airway & Oxygen Therapy: Patient Spontanous Breathing and Patient connected to face mask oxygen  Post-op Assessment: Report given to RN, Post -op Vital signs reviewed and stable and Patient moving all extremities  Post vital signs: Reviewed and stable  Last Vitals:  Vitals Value Taken Time  BP 211/128 02/09/2019  2:34 PM  Temp 37 C 02/09/2019  2:34 PM  Pulse 74 02/09/2019  2:42 PM  Resp 27 02/09/2019  2:42 PM  SpO2 100 % 02/09/2019  2:42 PM  Vitals shown include unvalidated device data.  Last Pain:  Vitals:   02/09/19 1434  TempSrc:   PainSc: 0-No pain      Patients Stated Pain Goal: 1 (88/87/57 9728)  Complications: No apparent anesthesia complications

## 2019-02-09 NOTE — Anesthesia Preprocedure Evaluation (Addendum)
Anesthesia Evaluation  Patient identified by MRN, date of birth, ID band Patient awake    Reviewed: Allergy & Precautions, NPO status , Patient's Chart, lab work & pertinent test results  History of Anesthesia Complications Negative for: history of anesthetic complications  Airway Mallampati: II  TM Distance: >3 FB Neck ROM: Full    Dental no notable dental hx.    Pulmonary neg pulmonary ROS,    Pulmonary exam normal breath sounds clear to auscultation       Cardiovascular negative cardio ROS Normal cardiovascular exam Rhythm:Regular Rate:Normal     Neuro/Psych negative neurological ROS     GI/Hepatic Neg liver ROS, GERD  Medicated,Acute cholecystitis   Endo/Other  negative endocrine ROS  Renal/GU negative Renal ROS     Musculoskeletal negative musculoskeletal ROS (+)   Abdominal   Peds  Hematology negative hematology ROS (+)   Anesthesia Other Findings Day of surgery medications reviewed with the patient.  Reproductive/Obstetrics                            Anesthesia Physical Anesthesia Plan  ASA: II and emergent  Anesthesia Plan: General   Post-op Pain Management:    Induction: Intravenous and Rapid sequence  PONV Risk Score and Plan: Treatment may vary due to age or medical condition, Ondansetron, Dexamethasone and Midazolam  Airway Management Planned: Oral ETT  Additional Equipment:   Intra-op Plan:   Post-operative Plan: Extubation in OR  Informed Consent: I have reviewed the patients History and Physical, chart, labs and discussed the procedure including the risks, benefits and alternatives for the proposed anesthesia with the patient or authorized representative who has indicated his/her understanding and acceptance.     Dental advisory given  Plan Discussed with: CRNA  Anesthesia Plan Comments:        Anesthesia Quick Evaluation

## 2019-02-09 NOTE — Progress Notes (Signed)
On arrival to [place], patient's blood pressures were the following:  177/92 (LA lying) @ 2302 (02/08/19) and 176/87 (RA lying) @ 2334 (02/08/19). Patient stated that he does not take any BP meds at home. Also no orders in the [facility] to lower BP. Dr. Leighton Ruff paged at 11:47 PM. Dr. Marcello Moores said "We will not be giving BP meds this evening." She stated to control the patient's pain. IV Dilaudid given at 2355 (02/08/19). Patient's BP still continues to be elevated. The patient's latest BP is 169/85.        Patient's admission, skin assessment, and physical assessment have been completed. No skin issues noted other than an old surgical scar in lower right abdomen from past appendectomy. PCR, informed consent, blood products consent form, and pre-op check list will be completed prior to patient's upcoming surgery.

## 2019-02-09 NOTE — Progress Notes (Signed)
Notify Dr. Ninfa Linden of patient Blood pressure of 195/95 no new orders at this time

## 2019-02-09 NOTE — Anesthesia Postprocedure Evaluation (Signed)
Anesthesia Post Note  Patient: Jack Perry  Procedure(s) Performed: LAPAROSCOPIC CHOLECYSTECTOMY WITH INTRAOPERATIVE CHOLANGIOGRAM (N/A Abdomen)     Patient location during evaluation: PACU Anesthesia Type: General Level of consciousness: awake and alert Pain management: pain level controlled Vital Signs Assessment: post-procedure vital signs reviewed and stable Respiratory status: spontaneous breathing, nonlabored ventilation and respiratory function stable Cardiovascular status: blood pressure returned to baseline and stable Postop Assessment: no apparent nausea or vomiting Anesthetic complications: no    Last Vitals:  Vitals:   02/09/19 1545 02/09/19 1600  BP: (!) 204/99 (!) 167/96  Pulse: 71 66  Resp: 20 18  Temp:    SpO2: 100% 100%    Last Pain:  Vitals:   02/09/19 1600  TempSrc:   PainSc: [place]

## 2019-02-09 NOTE — Plan of Care (Signed)
Plan of care discussed.   

## 2019-02-09 NOTE — Discharge Instructions (Signed)
[facility] ° °Please arrive at least 30 min before your appointment to complete your check in paperwork.  If you are unable to arrive 30 min prior to your appointment time we may have to cancel or reschedule you. °LAPAROSCOPIC SURGERY: POST OP INSTRUCTIONS °Always review your discharge instruction sheet given to you by the facility where your surgery was performed. °IF YOU HAVE DISABILITY OR FAMILY LEAVE FORMS, YOU MUST BRING THEM TO THE OFFICE FOR PROCESSING.   °DO NOT GIVE THEM TO YOUR DOCTOR. ° °PAIN CONTROL ° °1. First take acetaminophen (Tylenol) AND/or ibuprofen (Advil) to control your pain after surgery.  Follow directions on package.  Taking acetaminophen (Tylenol) and/or ibuprofen (Advil) regularly after surgery will help to control your pain and lower the amount of prescription pain medication you may need.  You should not take more than 4,000 mg (4 grams) of acetaminophen (Tylenol) in 24 hours.  You should not take ibuprofen (Advil), aleve, motrin, naprosyn or other NSAIDS if you have a history of stomach ulcers or chronic kidney disease.  °2. A prescription for pain medication may be given to you upon discharge.  Take your pain medication as prescribed, if you still have uncontrolled pain after taking acetaminophen (Tylenol) or ibuprofen (Advil). °3. Use ice packs to help control pain. °4. If you need a refill on your pain medication, please contact your pharmacy.  They will contact our office to request authorization. Prescriptions will not be filled after 5pm or on week-ends. ° °HOME MEDICATIONS °5. Take your usually prescribed medications unless otherwise directed. ° °DIET °6. You should follow a light diet the first few days after arrival home.  Be sure to include lots of fluids daily. Avoid fatty, fried foods.  ° °CONSTIPATION °7. It is common to experience some constipation after surgery and if you are taking pain medication.  Increasing fluid intake and taking a stool  softener (such as Colace) will usually help or prevent this problem from occurring.  A mild laxative (Milk of Magnesia or Miralax) should be taken according to package instructions if there are no bowel movements after 48 hours. ° °WOUND/INCISION CARE °8. Most patients will experience some swelling and bruising in the area of the incisions.  Ice packs will help.  Swelling and bruising can take several days to resolve.  °9. Unless discharge instructions indicate otherwise, follow guidelines below  °a. STERI-STRIPS - you may remove your outer bandages 48 hours after surgery, and you may shower at that time.  You have steri-strips (small skin tapes) in place directly over the incision.  These strips should be left on the skin for 7-10 days.   °b. DERMABOND/SKIN GLUE - you may shower in 24 hours.  The glue will flake off over the next 2-3 weeks. °10. Any sutures or staples will be removed at the office during your follow-up visit. ° °ACTIVITIES °11. You may resume regular (light) daily activities beginning the next day--such as daily self-care, walking, climbing stairs--gradually increasing activities as tolerated.  You may have sexual intercourse when it is comfortable.  Refrain from any heavy lifting or straining until approved by your doctor. °a. You may drive when you are no longer taking prescription pain medication, you can comfortably wear a seatbelt, and you can safely maneuver your car and apply brakes. ° °FOLLOW-UP °12. You should see your doctor in the office for a follow-up appointment approximately 2-3 weeks after your surgery.  You should have been given your post-op/follow-up appointment when   your surgery was scheduled.  If you did not receive a post-op/follow-up appointment, make sure that you call for this appointment within a day or two after you arrive home to insure a convenient appointment time. ° °OTHER INSTRUCTIONS ° °WHEN TO CALL YOUR DOCTOR: °1. Fever over 101.0 °2. Inability to  urinate °3. Continued bleeding from incision. °4. Increased pain, redness, or drainage from the incision. °5. Increasing abdominal pain ° °The clinic staff is available to answer your questions during regular business hours.  Please don’t hesitate to call and ask to speak to one of the nurses for clinical concerns.  If you have a medical emergency, go to the nearest emergency room or call 911.  A surgeon from [facility] is always on call at the hospital. °[address], [address], [place], [place]  [address] ? [address] [address], [place], [place]   [address] °[phone] ? [phone] ? FAX [phone] ° ° ° °

## 2019-02-09 NOTE — Progress Notes (Signed)
[facility] Progress Note     Subjective: CC-  Up in chair this morning. Abdomen sore but pain improved from last night. Some nausea, no emesis. States that he has been having intermittent abdominal pain for about 2 months, but last night the pain became severe so he decided to come to the ED.  Objective: Vital signs in last 24 hours: Temp:  [98.3 F (36.8 C)-99.2 F (37.3 C)] 98.5 F (36.9 C) (04/24 0525) Pulse Rate:  [57-84] 58 (04/24 0525) Resp:  [16-20] 18 (04/24 0525) BP: (148-177)/(85-118) 163/98 (04/24 0525) SpO2:  [96 %-100 %] 99 % (04/24 0525) Weight:  [120.4 kg] 120.4 kg (04/24 0155) Last BM Date: 02/08/19  Intake/Output from previous day: 04/23 0701 - 04/24 0700 In: 1347.7 [I.V.:1247.7; IV Piggyback:100] Out: 700 [Urine:700] Intake/Output this shift: Total I/O In: -  Out: 425 [Urine:425]  PE: Gen:  Alert, NAD, pleasant HEENT: EOM's intact, pupils equal and round Card:  RRR Pulm:  CTAB, no W/R/R, effort normal Abd: Soft, ND, +BS, mild epigastric TTP without rebound or guarding Ext: calves soft and nontender without edema Psych: A&Ox3  Skin: no rashes noted, warm and dry  Lab Results:  Recent Labs    02/08/19 1655  WBC 10.7*  HGB 16.0  HCT 50.3  PLT 172   BMET Recent Labs    02/08/19 1655  NA 138  K 4.2  CL 104  CO2 25  GLUCOSE 93  BUN 16  CREATININE 0.94  CALCIUM 9.4   PT/INR No results for input(s): LABPROT, INR in the last 72 hours. CMP     Component Value Date/Time   NA 138 02/08/2019 1655   K 4.2 02/08/2019 1655   CL 104 02/08/2019 1655   CO2 25 02/08/2019 1655   GLUCOSE 93 02/08/2019 1655   BUN 16 02/08/2019 1655   CREATININE 0.94 02/08/2019 1655   CALCIUM 9.4 02/08/2019 1655   PROT 7.7 02/08/2019 1655   ALBUMIN 4.4 02/08/2019 1655   AST 23 02/08/2019 1655   ALT 14 02/08/2019 1655   ALKPHOS 71 02/08/2019 1655   BILITOT 0.9 02/08/2019 1655   GFRNONAA >60 02/08/2019 1655   GFRAA >60 02/08/2019 1655    Lipase     Component Value Date/Time   LIPASE 28 02/08/2019 1655       Studies/Results: Ct Abdomen Pelvis W Contrast  Result Date: 02/08/2019 CLINICAL DATA:  Patient c/o constant sharp epigastric pain x1 month. Reports noticed pain started after colonoscopy. Denies N/V/D. States pain worsens with eating EXAM: CT ABDOMEN AND PELVIS WITH CONTRAST TECHNIQUE: Multidetector CT imaging of the abdomen and pelvis was performed using the standard protocol following bolus administration of intravenous contrast. CONTRAST:  122mL OMNIPAQUE IOHEXOL 300 MG/ML  SOLN COMPARISON:  None. FINDINGS: Lower chest: No acute abnormality. Hepatobiliary: No focal liver abnormality is seen. Distended gallbladder with gallbladder wall thickening and a small amount of pericholecystic fluid. Mild inflammatory changes around the gallbladder. Pancreas: Unremarkable. No pancreatic ductal dilatation or surrounding inflammatory changes. Spleen: Normal in size without focal abnormality. Adrenals/Urinary Tract: Adrenal glands are unremarkable. Kidneys are normal, without renal calculi, focal lesion, or hydronephrosis. Bladder is unremarkable. Stomach/Bowel: Stomach is within normal limits. Appendix appears normal. No evidence of bowel wall thickening, distention, or inflammatory changes. Vascular/Lymphatic: No significant vascular findings are present. No enlarged abdominal or pelvic lymph nodes. Reproductive: Prostate is unremarkable. Other: No abdominal wall hernia or abnormality. No abdominopelvic ascites. Musculoskeletal: No acute or significant osseous findings. 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 IMPRESSION: 1. Distended gallbladder with mild pericholecystic fluid, gallbladder wall thickening and mild surrounding inflammatory changes. Overall appearance is most concerning for acute cholecystitis. Recommend further evaluation with a right upper quadrant ultrasound for evaluation  cholelithiasis. Electronically Signed   By: Kathreen Devoid   On: 02/08/2019 18:31   US Abdomen Limited Ruq  Result Date: 02/08/2019 CLINICAL DATA:  Right upper quadrant abdominal pain. Symptoms for 1 month. Abnormal appearance gallbladder on preceding CT. EXAM: ULTRASOUND ABDOMEN LIMITED RIGHT UPPER QUADRANT COMPARISON:  CT same date. FINDINGS: Gallbladder: There is curvilinear echogenicity in the gallbladder fundus, without corresponding calcification or air on preceding CT. Several gallstones are present as well as sludge. There is gallbladder wall thickening to 5 mm and mild pericholecystic fluid. Per the sonographer, there is a positive sonographic Murphy's sign. Common bile duct: Diameter: 6 mm Liver: No focal lesion identified. Within normal limits in parenchymal echogenicity. Portal vein is patent on color Doppler imaging with normal direction of blood flow towards the liver. IMPRESSION: 1. Cholelithiasis, gallbladder wall thickening and positive sonographic Murphy sign consistent with acute cholecystitis as correlated with preceding CT. 2. No biliary dilatation. Electronically Signed   By: Richardean Sale M.D.   On: 02/08/2019 20:06    Anti-infectives: Anti-infectives (From admission, onward)   Start     Dose/Rate Route Frequency Ordered Stop   02/08/19 2345  cefTRIAXone (ROCEPHIN) 2 g in sodium chloride 0.9 % 100 mL IVPB     2 g 200 mL/hr over 30 Minutes Intravenous Daily at bedtime 02/08/19 2335         Assessment/Plan Acute cholecystitis - Patient with gallstones, gallbladder wall thickening on u/s and positive sonographic Murphy sign. Keep NPO and continue IV rocephin. Likely plan for cholecystectomy later today.  ID - rocephin 4/23>> FEN - IVF, NPO VTE - SCDs, lovenox Foley - none Follow up - TBD   LOS: 0 days    Wellington Hampshire , [facility] 02/09/2019, 8:53 AM Pager: [phone] Mon-Thurs 7:00 am-4:30 pm Fri 7:00 am -11:30 AM Sat-Sun 7:00 am-11:30  am

## 2019-02-09 NOTE — Anesthesia Procedure Notes (Signed)
Procedure Name: Intubation Date/Time: 02/09/2019 12:23 PM Performed by: Mitzie Na, CRNA Pre-anesthesia Checklist: Patient identified, Emergency Drugs available, Suction available, Patient being monitored and Timeout performed Patient Re-evaluated:Patient Re-evaluated prior to induction Oxygen Delivery Method: Circle system utilized Preoxygenation: Pre-oxygenation with 100% oxygen Induction Type: IV induction and Rapid sequence Laryngoscope Size: Mac and 4 Grade View: Grade I Tube type: Oral Number of attempts: 1 Airway Equipment and Method: Stylet Placement Confirmation: ETT inserted through vocal cords under direct vision,  positive ETCO2 and breath sounds checked- equal and bilateral Secured at: 25 cm Tube secured with: Tape Dental Injury: Teeth and Oropharynx as per pre-operative assessment

## 2019-02-10 ENCOUNTER — Encounter (HOSPITAL_COMMUNITY): Payer: Self-pay | Admitting: Surgery

## 2019-02-10 LAB — COMPREHENSIVE METABOLIC PANEL
ALT: 46 U/L — ABNORMAL HIGH (ref 0–44)
AST: 94 U/L — ABNORMAL HIGH (ref 15–41)
Albumin: 3.7 g/dL (ref 3.5–5.0)
Alkaline Phosphatase: 60 U/L (ref 38–126)
Anion gap: 10 (ref 5–15)
BUN: 15 mg/dL (ref 8–23)
CO2: 24 mmol/L (ref 22–32)
Calcium: 8.9 mg/dL (ref 8.9–10.3)
Chloride: 101 mmol/L (ref 98–111)
Creatinine, Ser: 0.98 mg/dL (ref 0.61–1.24)
GFR calc Af Amer: >60 mL/min (ref >60)
GFR calc non Af Amer: >60 mL/min (ref >60)
Glucose, Bld: 147 mg/dL — ABNORMAL HIGH (ref 70–99)
Potassium: 4.3 mmol/L (ref 3.5–5.1)
Sodium: 135 mmol/L (ref 135–145)
Total Bilirubin: 1.2 mg/dL (ref 0.3–1.2)
Total Protein: 6.9 g/dL (ref 6.5–8.1)

## 2019-02-10 LAB — CBC
HCT: 46.5 % (ref 39.0–52.0)
Hemoglobin: 15.1 g/dL (ref 13.0–17.0)
MCH: 30.4 pg (ref 26.0–34.0)
MCHC: 32.5 g/dL (ref 30.0–36.0)
MCV: 93.8 fL (ref 80.0–100.0)
Platelets: 171 10*3/uL (ref 150–400)
RBC: 4.96 MIL/uL (ref 4.22–5.81)
RDW: 13.2 % (ref 11.5–15.5)
WBC: 15 10*3/uL — ABNORMAL HIGH (ref 4.0–10.5)
nRBC: 0 % (ref 0.0–0.2)

## 2019-02-10 LAB — HIV ANTIBODY (ROUTINE TESTING W REFLEX): HIV Screen 4th Generation wRfx: NONREACTIVE

## 2019-02-10 MED ORDER — TRAMADOL HCL 50 MG PO TABS: 50.0000 mg | ORAL_TABLET | Freq: Four times a day (QID) | ORAL | 0 refills | Status: DC | PRN

## 2019-02-10 NOTE — Progress Notes (Signed)
Reviewed discharge instructions and medications. Patient verbalizes understanding and has no further questions. Will continue to monitor.

## 2019-02-10 NOTE — Discharge Summary (Signed)
Physician Discharge Summary  Patient ID: Petr Bontempo MRN: [id number] DOB/AGE: 1951/02/13 68 y.o.  Admit date: 02/08/2019 Discharge date: 02/10/2019  Admission Diagnoses:  Discharge Diagnoses:  Active Problems:   Cholecystitis   Discharged Condition: good  Hospital Course: uneventful admission and post op recovery.  Discharged home POD #1 doing well with drain in  Consults: None  Significant Diagnostic Studies:   Treatments: surgery: Laparoscopic cholecystectomy  Discharge Exam: Blood pressure (!) 153/81, pulse (!) 59, temperature 99.3 F (37.4 C), temperature source Oral, resp. rate 18, height 6\' 3"  (1.905 m), weight 120.4 kg, SpO2 99 %. General appearance: alert, cooperative and no distress Resp: clear to auscultation bilaterally Cardio: regular rate and rhythm, S1, S2 normal, no murmur, click, rub or gallop Incision/Wound:abdomen soft, drain serosang  Disposition: Discharge disposition: 01-Home or Self Care        Allergies as of 02/10/2019   No Known Allergies     Medication List    TAKE these medications   omeprazole 20 MG capsule Commonly known as:  PRILOSEC Take 20 mg by mouth daily.   traMADol 50 MG tablet Commonly known as:  Ultram Take 1 tablet (50 mg total) by mouth every 6 (six) hours as needed for moderate pain or severe pain.      Follow-up [facility], [place]. Schedule an appointment as soon as possible for a visit in 2 day(s).   Specialty:  General Surgery Why:  call office monday morning. need nurses visit to remove drain Contact information: [address] [place] [phone]          Signed: Coralie Keens 02/10/2019, 9:39 AM

## 2019-02-10 NOTE — Progress Notes (Signed)
Patient ID: Jack Perry, male   DOB: [birth year], 68 y.o.   MRN: [id number]  Doing well Pain controlled Abdomen soft Labs ok Drain serosang  D/c home with drain

## 2019-07-24 DIAGNOSIS — E7849 Other hyperlipidemia: Secondary | ICD-10-CM | POA: Diagnosis not present

## 2019-07-24 DIAGNOSIS — Z125 Encounter for screening for malignant neoplasm of prostate: Secondary | ICD-10-CM | POA: Diagnosis not present

## 2019-07-31 DIAGNOSIS — R69 Illness, unspecified: Secondary | ICD-10-CM | POA: Diagnosis not present

## 2019-07-31 DIAGNOSIS — Z0000 Encounter for general adult medical examination without abnormal findings: Secondary | ICD-10-CM | POA: Diagnosis not present

## 2019-07-31 DIAGNOSIS — Z1339 Encounter for screening examination for other mental health and behavioral disorders: Secondary | ICD-10-CM | POA: Diagnosis not present

## 2019-07-31 DIAGNOSIS — N529 Male erectile dysfunction, unspecified: Secondary | ICD-10-CM | POA: Diagnosis not present

## 2019-07-31 DIAGNOSIS — M25561 Pain in right knee: Secondary | ICD-10-CM | POA: Diagnosis not present

## 2019-07-31 DIAGNOSIS — I10 Essential (primary) hypertension: Secondary | ICD-10-CM | POA: Diagnosis not present

## 2019-07-31 DIAGNOSIS — E785 Hyperlipidemia, unspecified: Secondary | ICD-10-CM | POA: Diagnosis not present

## 2019-07-31 DIAGNOSIS — E669 Obesity, unspecified: Secondary | ICD-10-CM | POA: Diagnosis not present

## 2019-08-01 DIAGNOSIS — N5201 Erectile dysfunction due to arterial insufficiency: Secondary | ICD-10-CM | POA: Diagnosis not present

## 2019-08-08 DIAGNOSIS — R82998 Other abnormal findings in urine: Secondary | ICD-10-CM | POA: Diagnosis not present

## 2019-08-08 DIAGNOSIS — Z1212 Encounter for screening for malignant neoplasm of rectum: Secondary | ICD-10-CM | POA: Diagnosis not present

## 2019-08-22 DIAGNOSIS — I10 Essential (primary) hypertension: Secondary | ICD-10-CM | POA: Diagnosis not present

## 2019-08-22 DIAGNOSIS — M25461 Effusion, right knee: Secondary | ICD-10-CM | POA: Diagnosis not present

## 2019-08-31 IMAGING — US ULTRASOUND ABDOMEN LIMITED
1 series · 14 of 25 positions shown · non-contrast
Comparison: CT same date.

CLINICAL DATA: Right upper quadrant abdominal pain. Symptoms for 1
month. Abnormal appearance gallbladder on preceding CT.

EXAM:
ULTRASOUND ABDOMEN LIMITED RIGHT UPPER QUADRANT

[Series 1: ultrasound abdomen limited · 14 of 40 slices shown]
[im 1/40]
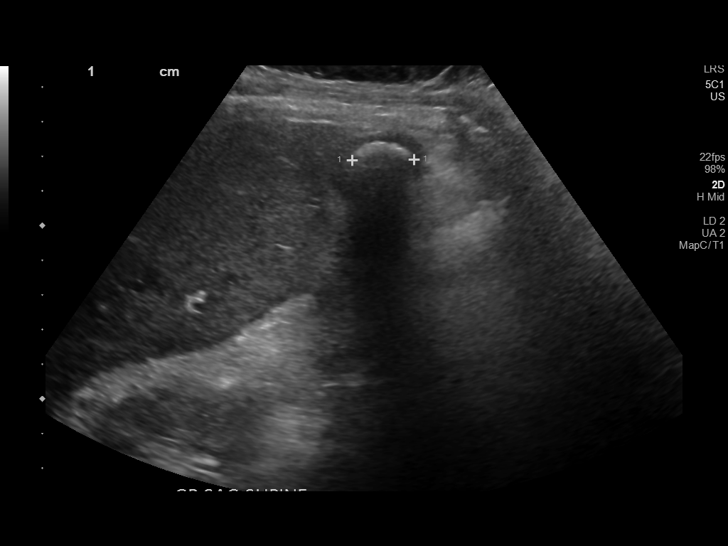
[im 4/40]
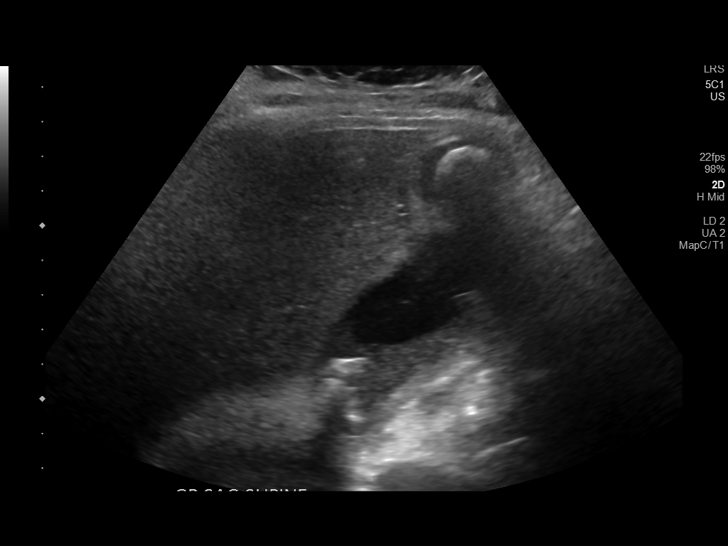
[im 7/40]
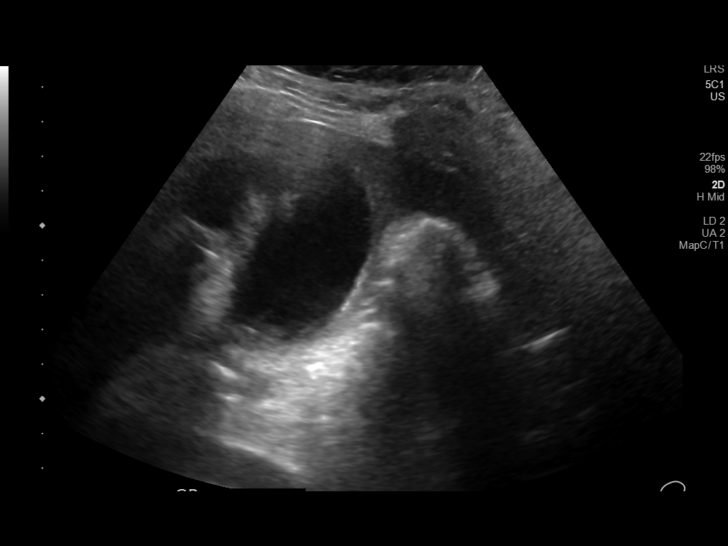
[im 10/40]
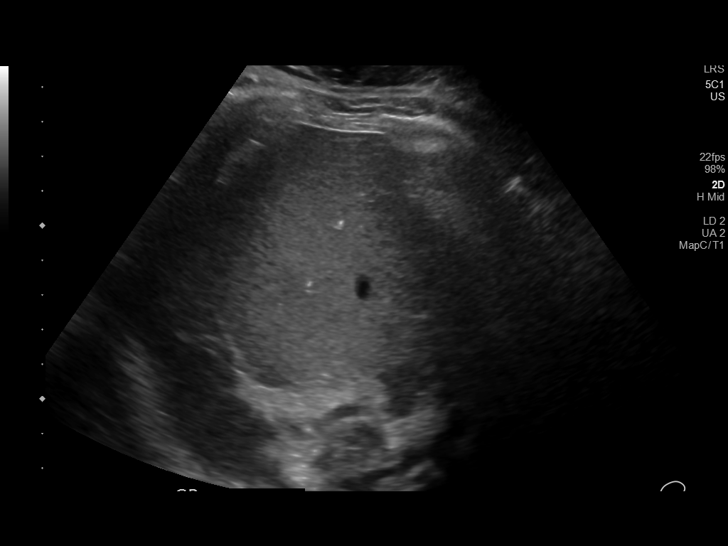
[im 14/40]
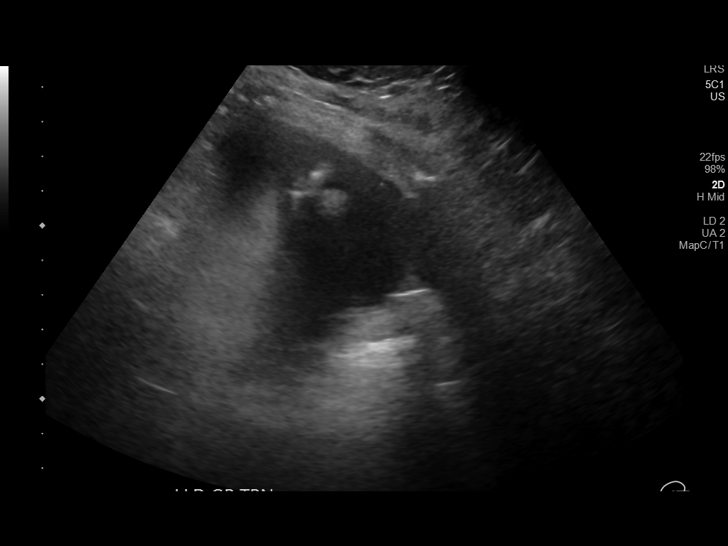
[im 15/40]
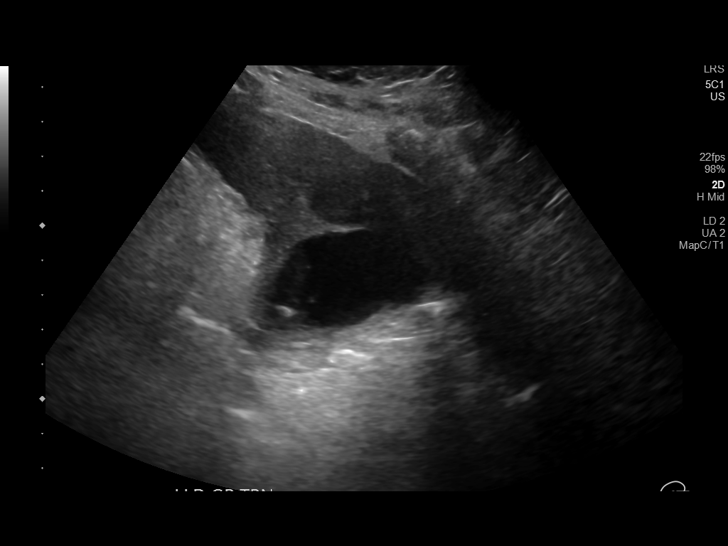
[im 18/40]
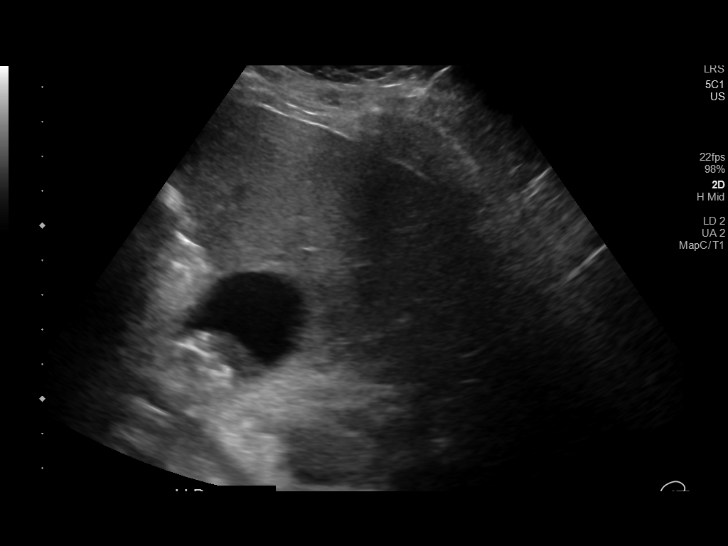
[im 22/40]
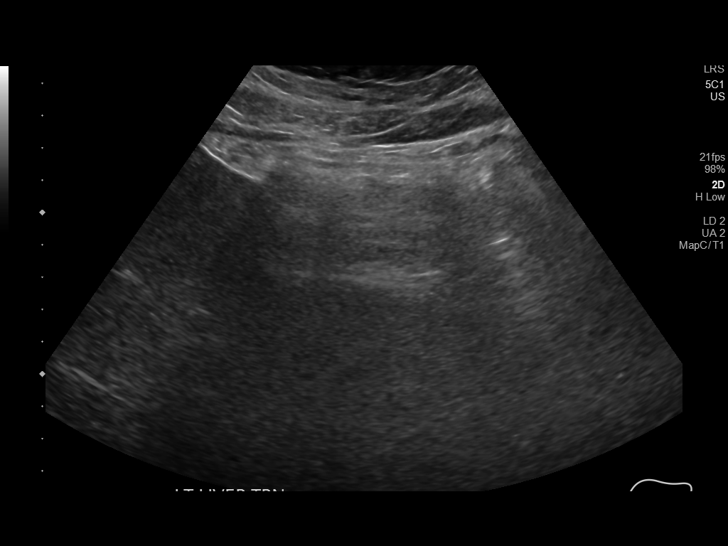
[im 25/40]
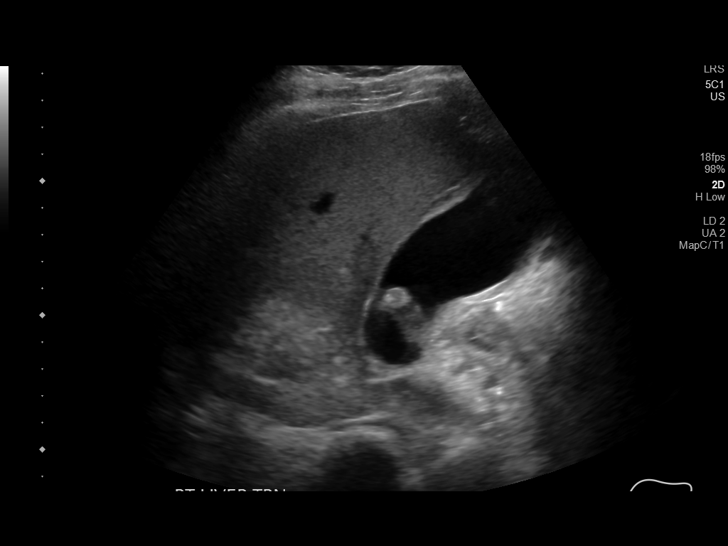
[im 27/40]
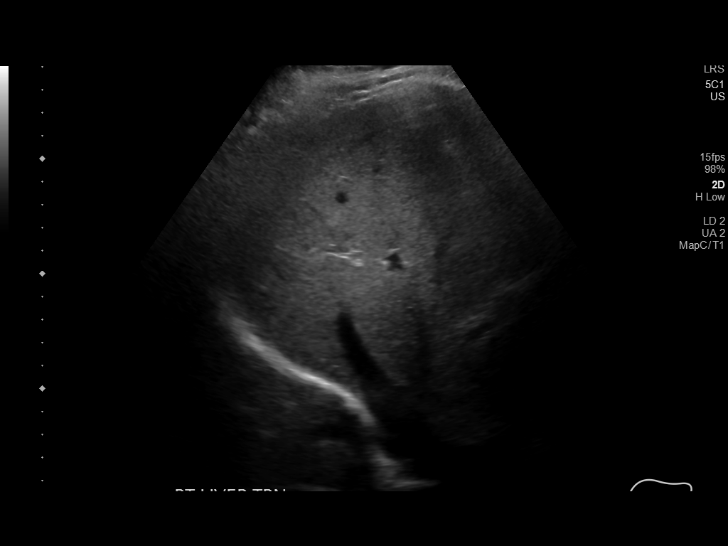
[im 30/40]
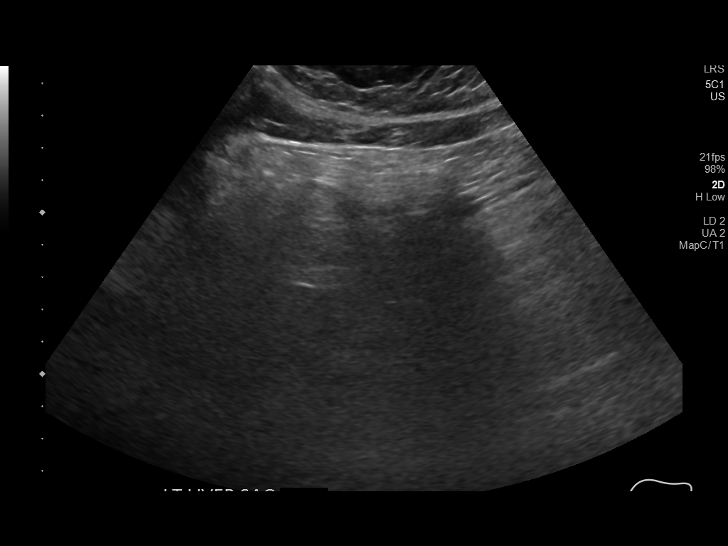
[im 33/40]
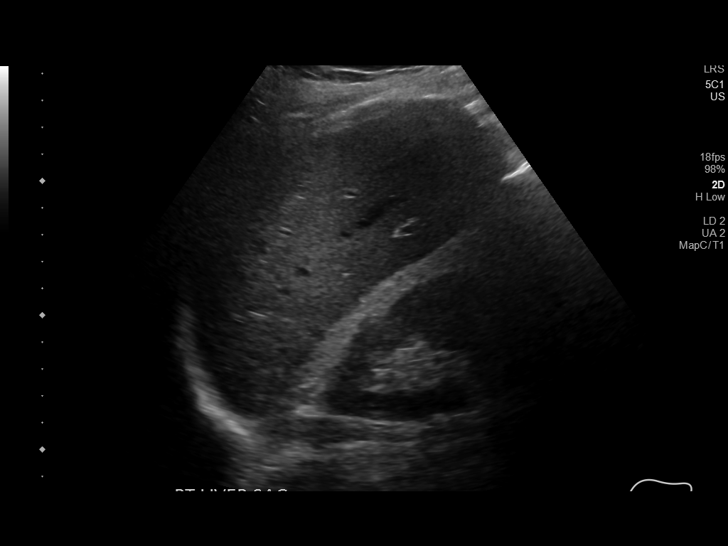
[im 36/40]
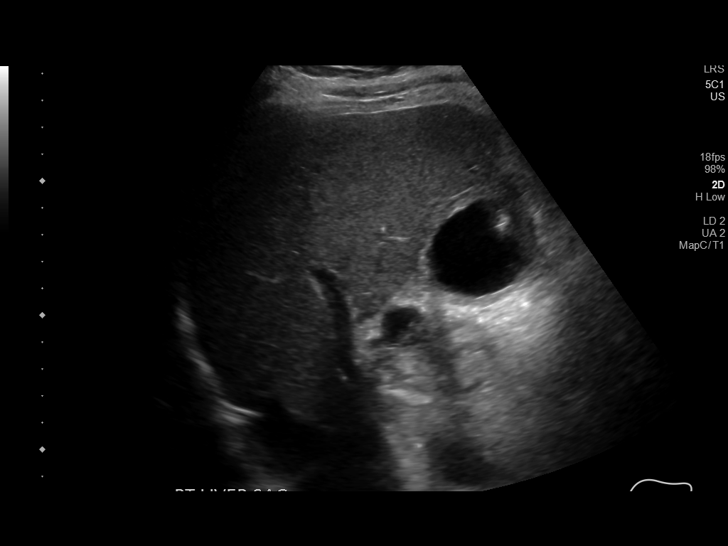
[im 40/40]
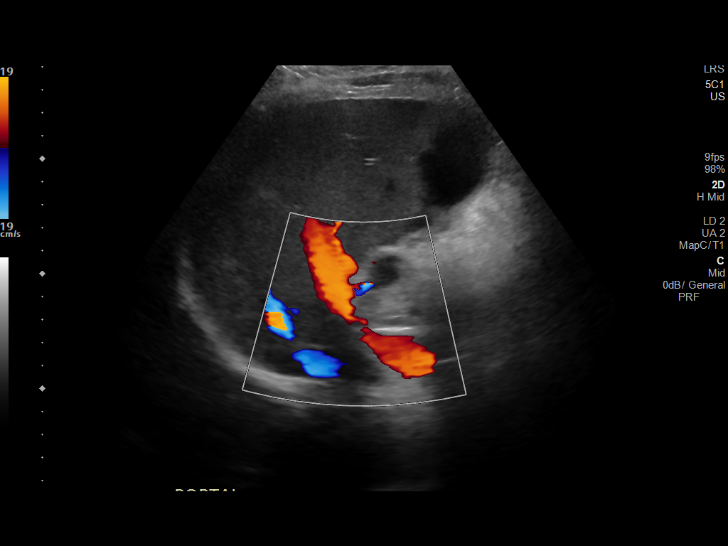

[14 of 25 positions shown; findings below may reference images not displayed]

FINDINGS: Gallbladder:

There is curvilinear echogenicity in the gallbladder fundus, without
corresponding calcification or air on preceding CT. Several
gallstones are present as well as sludge. There is gallbladder wall
thickening to 5 mm and mild pericholecystic fluid. Per the
sonographer, there is a positive sonographic Murphy's sign.

Common bile duct:

Diameter: 6 mm

Liver:

No focal lesion identified. Within normal limits in parenchymal
echogenicity. Portal vein is patent on color Doppler imaging with
normal direction of blood flow towards the liver.
IMPRESSION: 1. Cholelithiasis, gallbladder wall thickening and positive
sonographic Murphy sign consistent with acute cholecystitis as
correlated with preceding CT.
2. No biliary dilatation.

## 2019-08-31 IMAGING — CT CT ABDOMEN AND PELVIS WITH CONTRAST
2 of 5 series · 16 of 46 positions shown, 18 images · IV contrast (ISOVUE)
Comparison: None.

CLINICAL DATA: Patient c/o constant sharp epigastric pain x1 month.
Reports noticed pain started after colonoscopy. Denies N/V/D. States
pain worsens with eating

EXAM:
CT ABDOMEN AND PELVIS WITH CONTRAST
TECHNIQUE: Multidetector CT imaging of the abdomen and pelvis was performed
using the standard protocol following bolus administration of
intravenous contrast.
CONTRAST:  100mL OMNIPAQUE IOHEXOL 300 MG/ML  SOLN

[Series 2: axial st · axial · 0.91mm/px · z∈[+1130,+1555]mm · 13 of 99 slices shown, 15 images]
[im 7/99  soft-tissue]
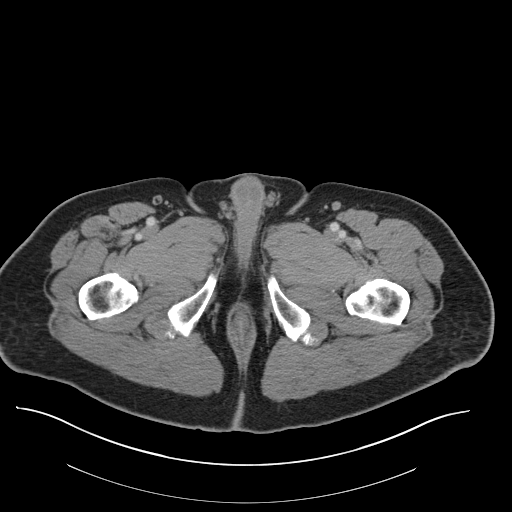
[im 7/99  bone]
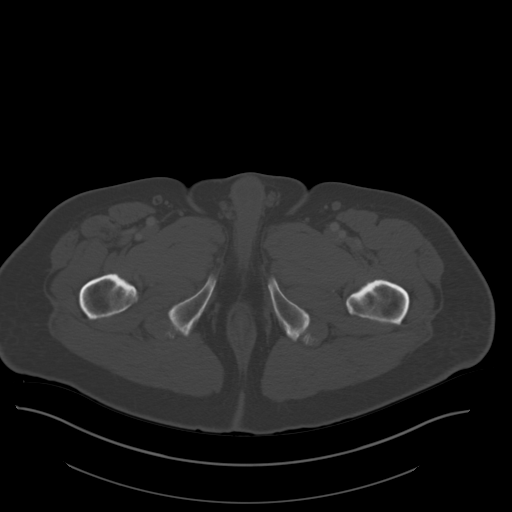
[im 14/99  soft-tissue]
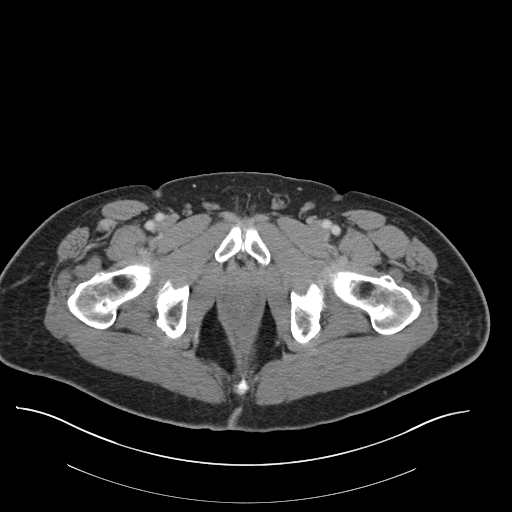
[im 20/99  soft-tissue]
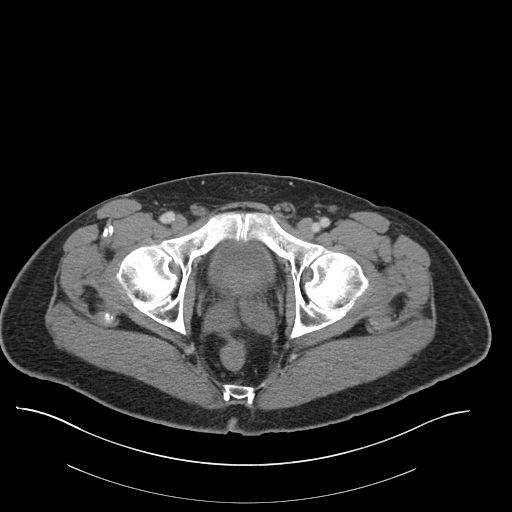
[im 27/99  soft-tissue]
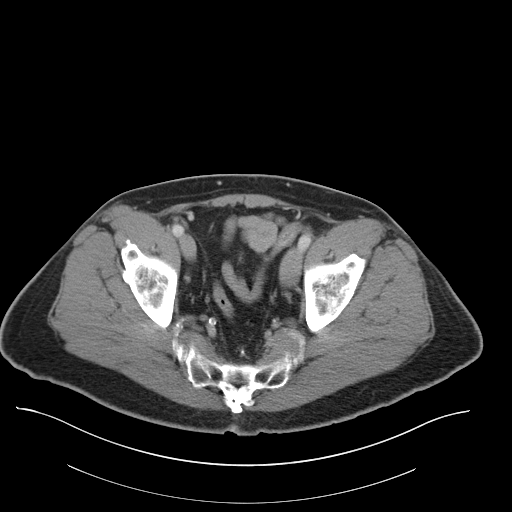
[im 33/99  soft-tissue]
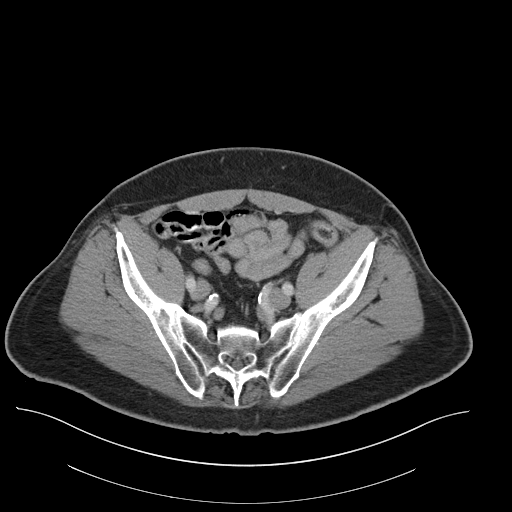
[im 40/99  soft-tissue]
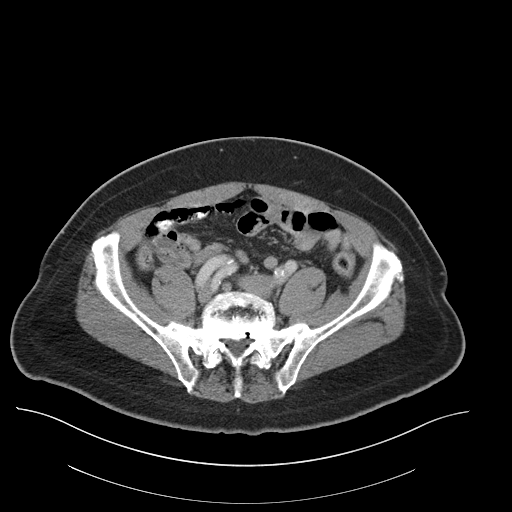
[im 53/99  soft-tissue]
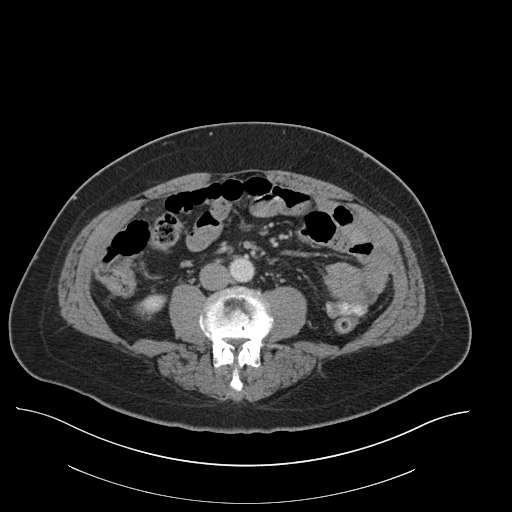
[im 59/99  soft-tissue]
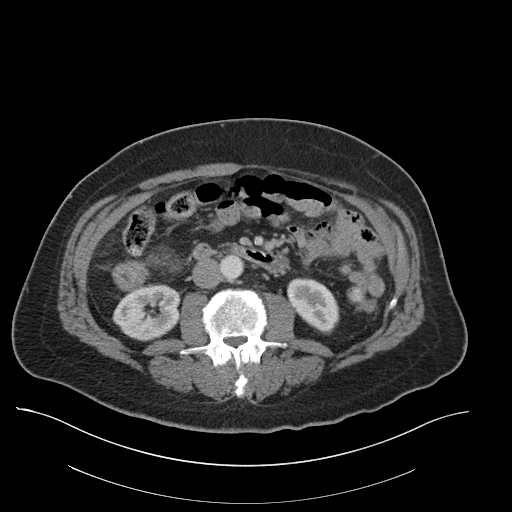
[im 66/99  soft-tissue]
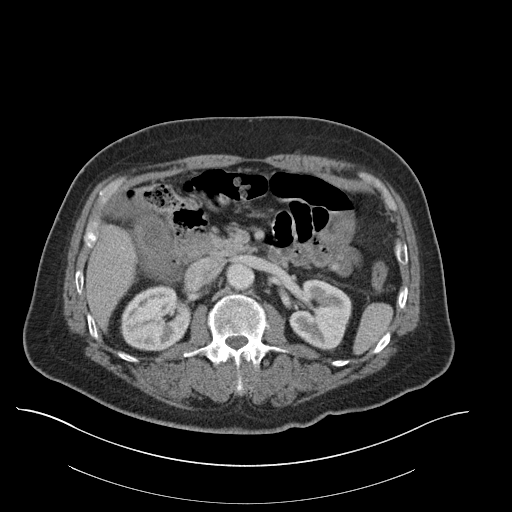
[im 66/99  bone]
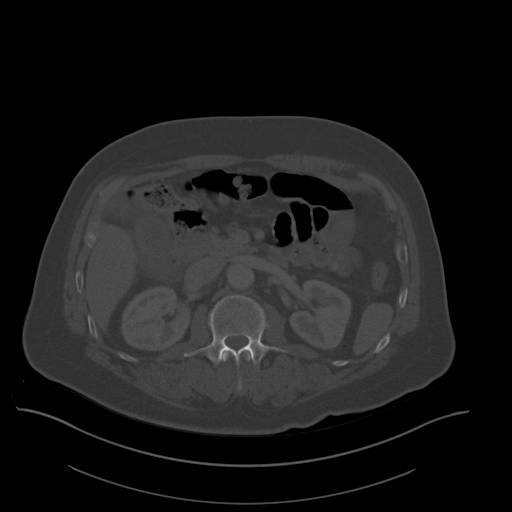
[im 72/99  soft-tissue]
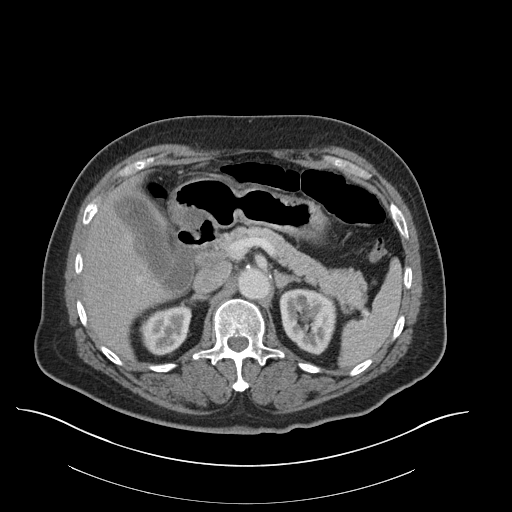
[im 79/99  soft-tissue]
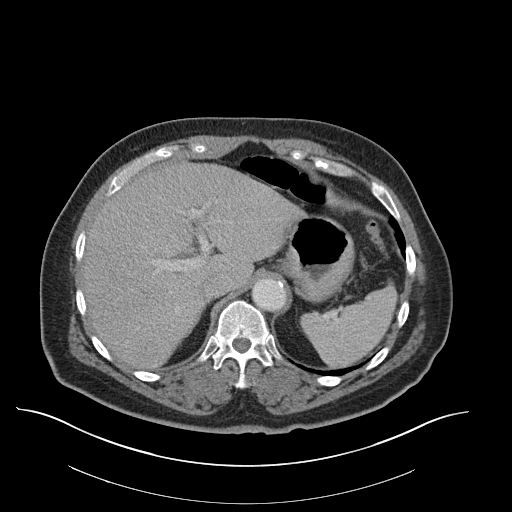
[im 85/99  soft-tissue]
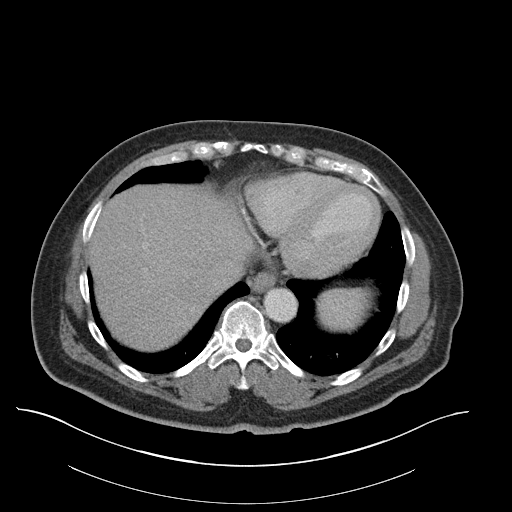
[im 92/99  soft-tissue]
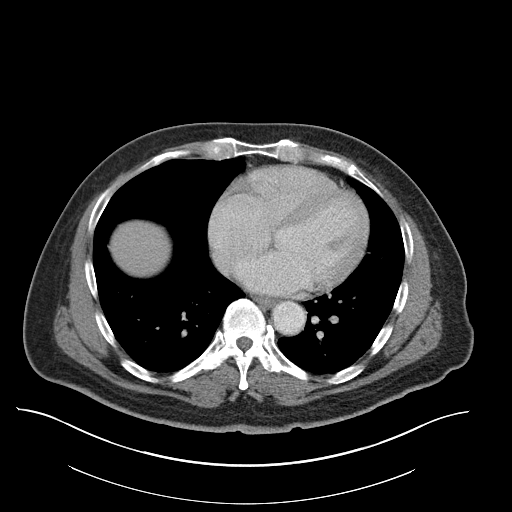

[Series 5: coronal st · coronal · 0.79mm/px · 3 of 148 slices shown]
[im 50/148  soft-tissue]
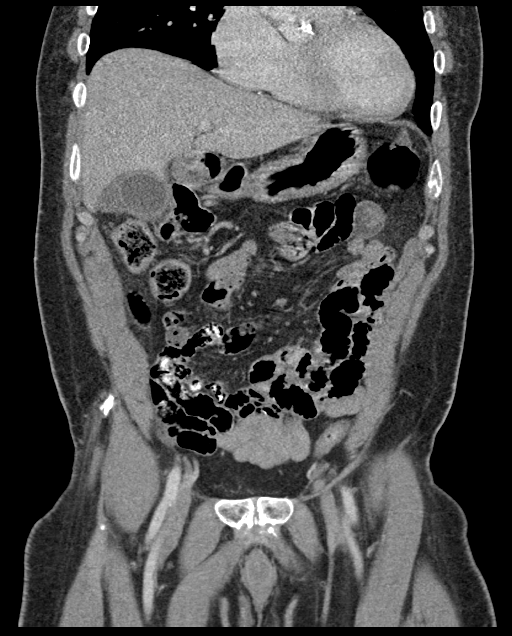
[im 66/148  soft-tissue]
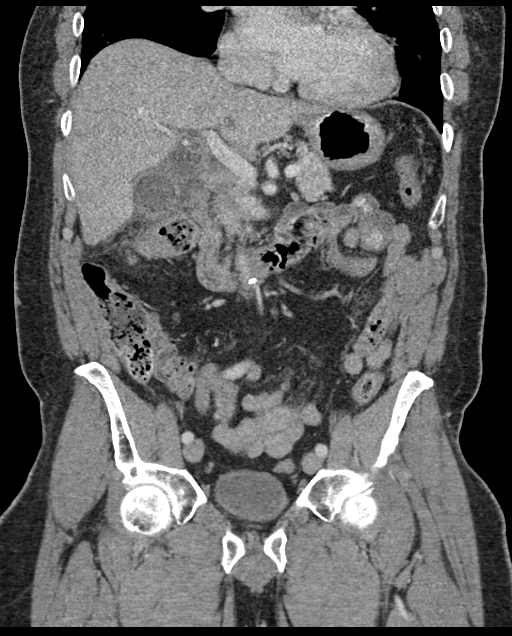
[im 82/148  soft-tissue]
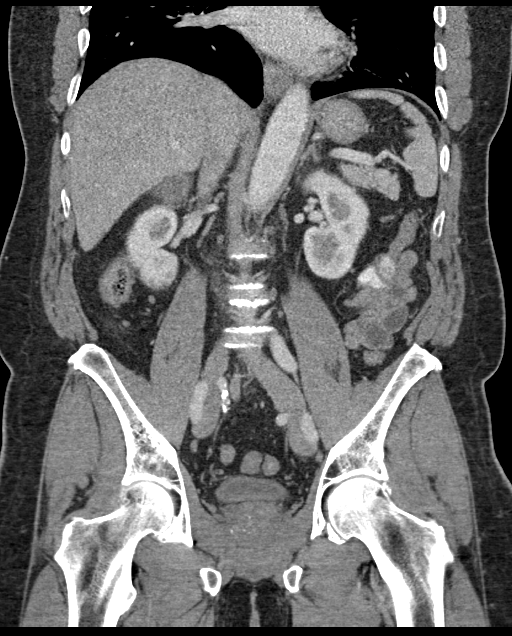

[16 of 46 positions shown; findings below may reference images not displayed]

FINDINGS: Lower chest: No acute abnormality.

Hepatobiliary: No focal liver abnormality is seen. Distended
gallbladder with gallbladder wall thickening and a small amount of
pericholecystic fluid. Mild inflammatory changes around the
gallbladder.

Pancreas: Unremarkable. No pancreatic ductal dilatation or
surrounding inflammatory changes.

Spleen: Normal in size without focal abnormality.

Adrenals/Urinary Tract: Adrenal glands are unremarkable. Kidneys are
normal, without renal calculi, focal lesion, or hydronephrosis.
Bladder is unremarkable.

Stomach/Bowel: Stomach is within normal limits. Appendix appears
normal. No evidence of bowel wall thickening, distention, or
inflammatory changes.

Vascular/Lymphatic: No significant vascular findings are present. No
enlarged abdominal or pelvic lymph nodes.

Reproductive: Prostate is unremarkable.

Other: No abdominal wall hernia or abnormality. No abdominopelvic
ascites.

Musculoskeletal: No acute or significant osseous findings.
Degenerative disease with disc height loss L3-4 L5-S1 with bilateral
facet arthropathy.
IMPRESSION: 1. Distended gallbladder with mild pericholecystic fluid,
gallbladder wall thickening and mild surrounding inflammatory
changes. Overall appearance is most concerning for acute
cholecystitis. Recommend further evaluation with a right upper
quadrant ultrasound for evaluation cholelithiasis.

## 2019-09-01 IMAGING — RF INTRAOPERATIVE CHOLANGIOGRAM
1 series · 4 of 4 positions shown · non-contrast
Comparison: None.

CLINICAL DATA: Intraoperative cholangiogram

EXAM:
INTRAOPERATIVE CHOLANGIOGRAM
TECHNIQUE: Cholangiographic images from the C-arm fluoroscopic device were
submitted for interpretation post-operatively. Please see the
procedural report for the amount of contrast and the fluoroscopy
time utilized.

[Series 1: run · 4 of 165 frames shown]
[frame 25/165]
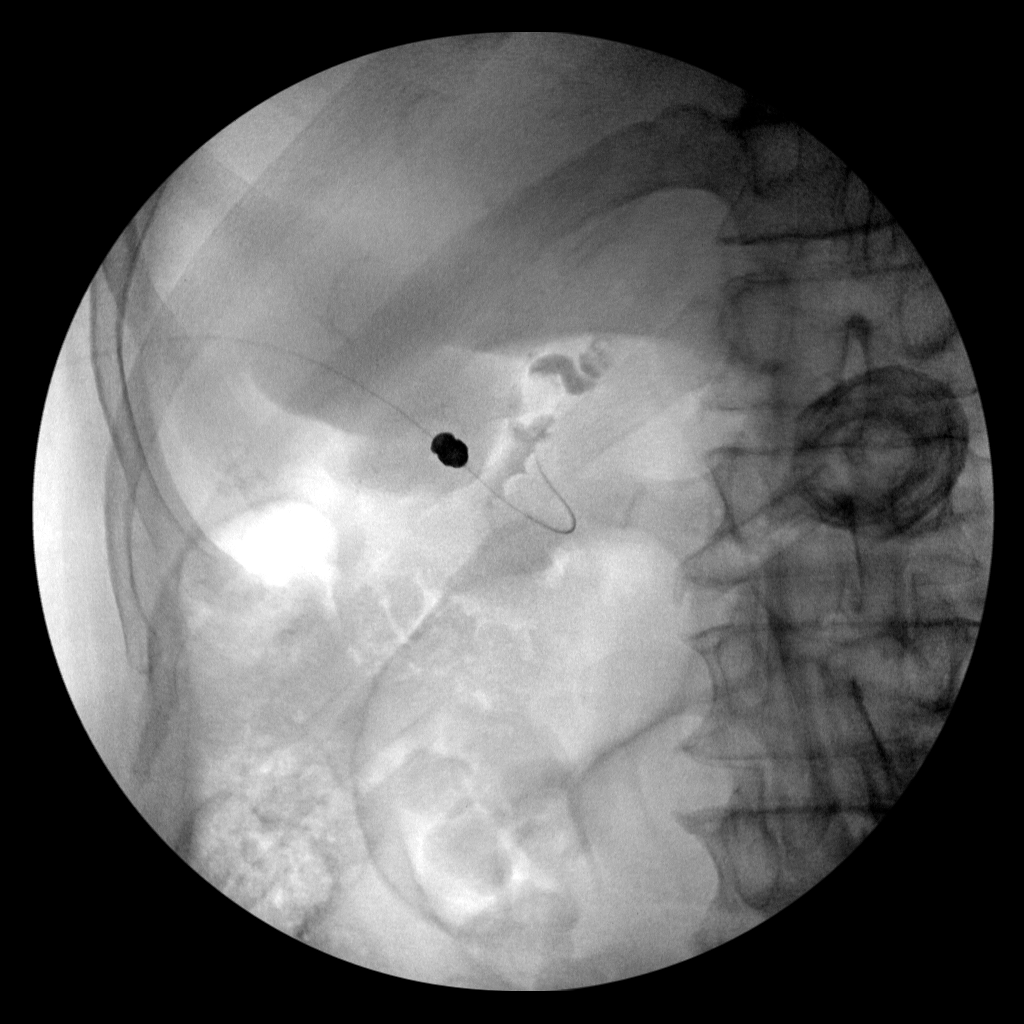
[frame 83/165]
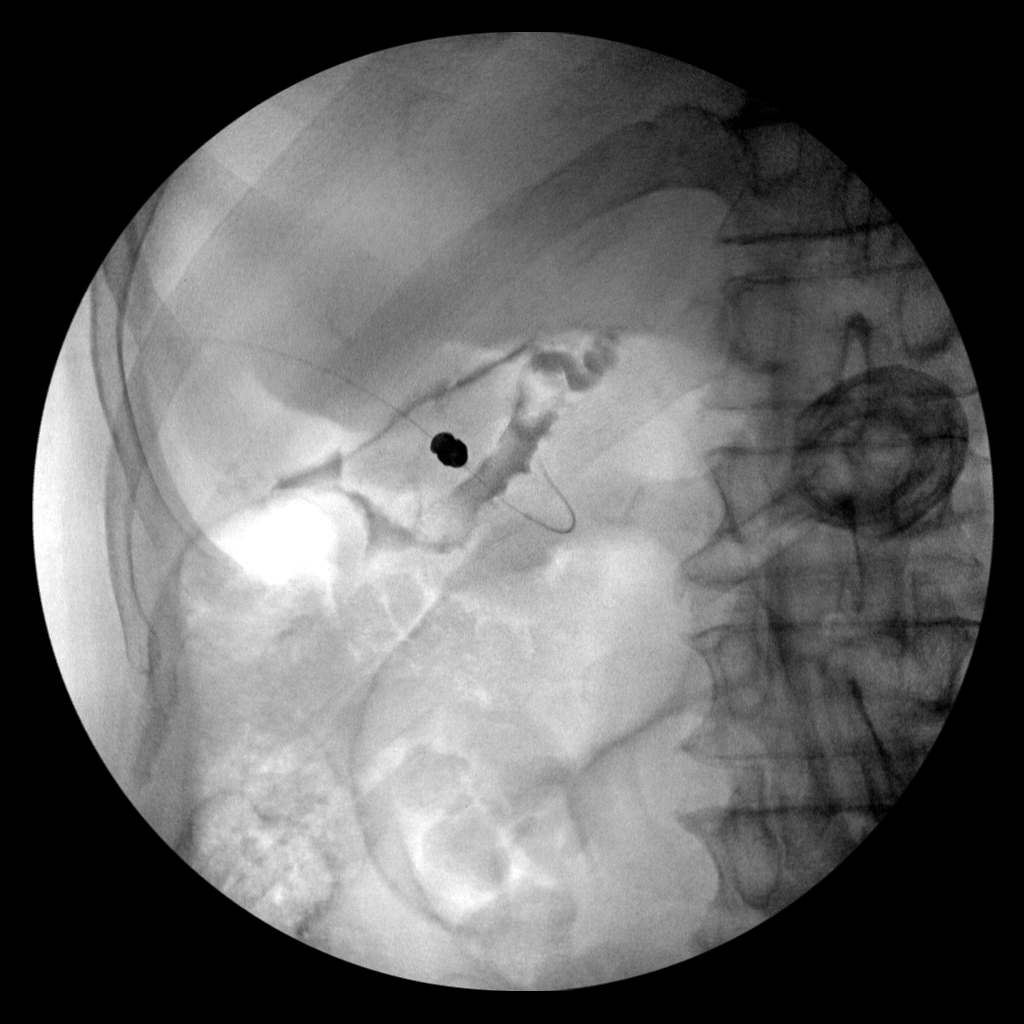
[frame 141/165]
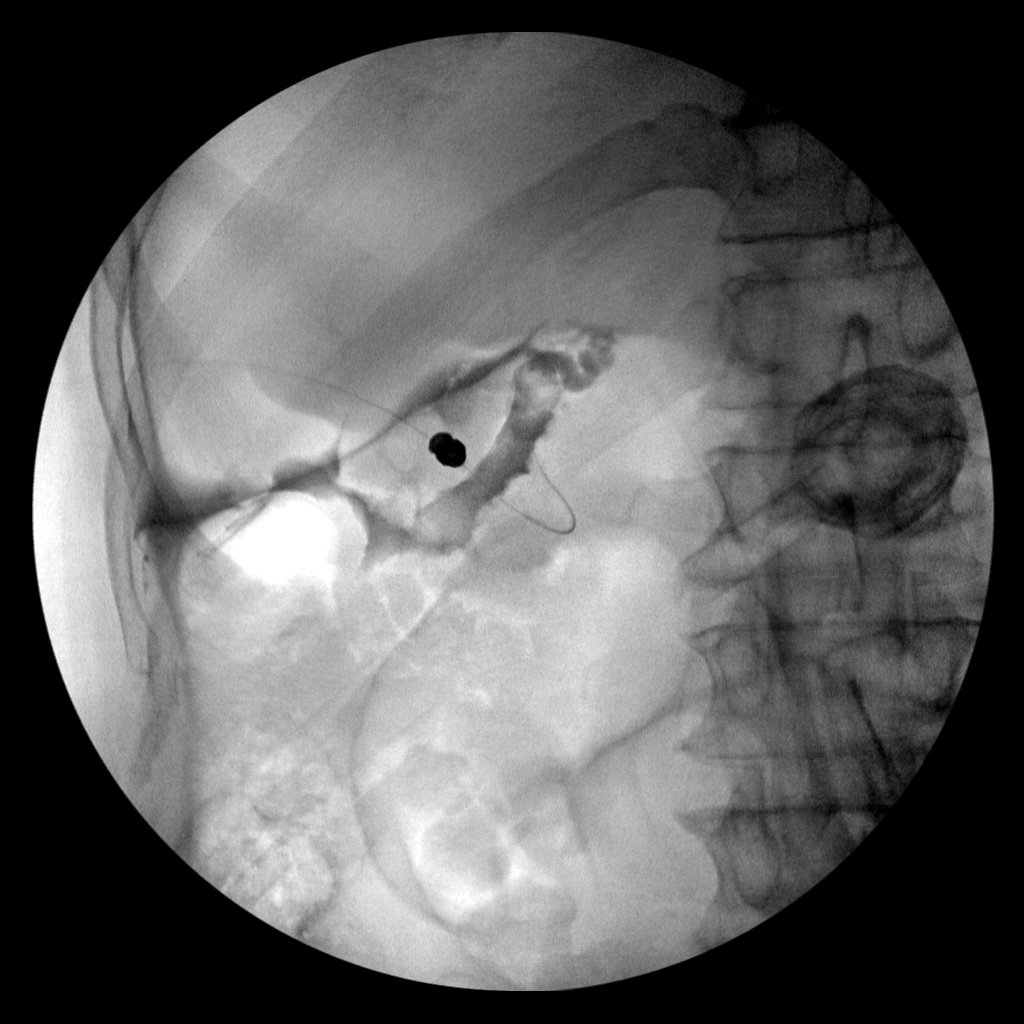
[frame 158/165]
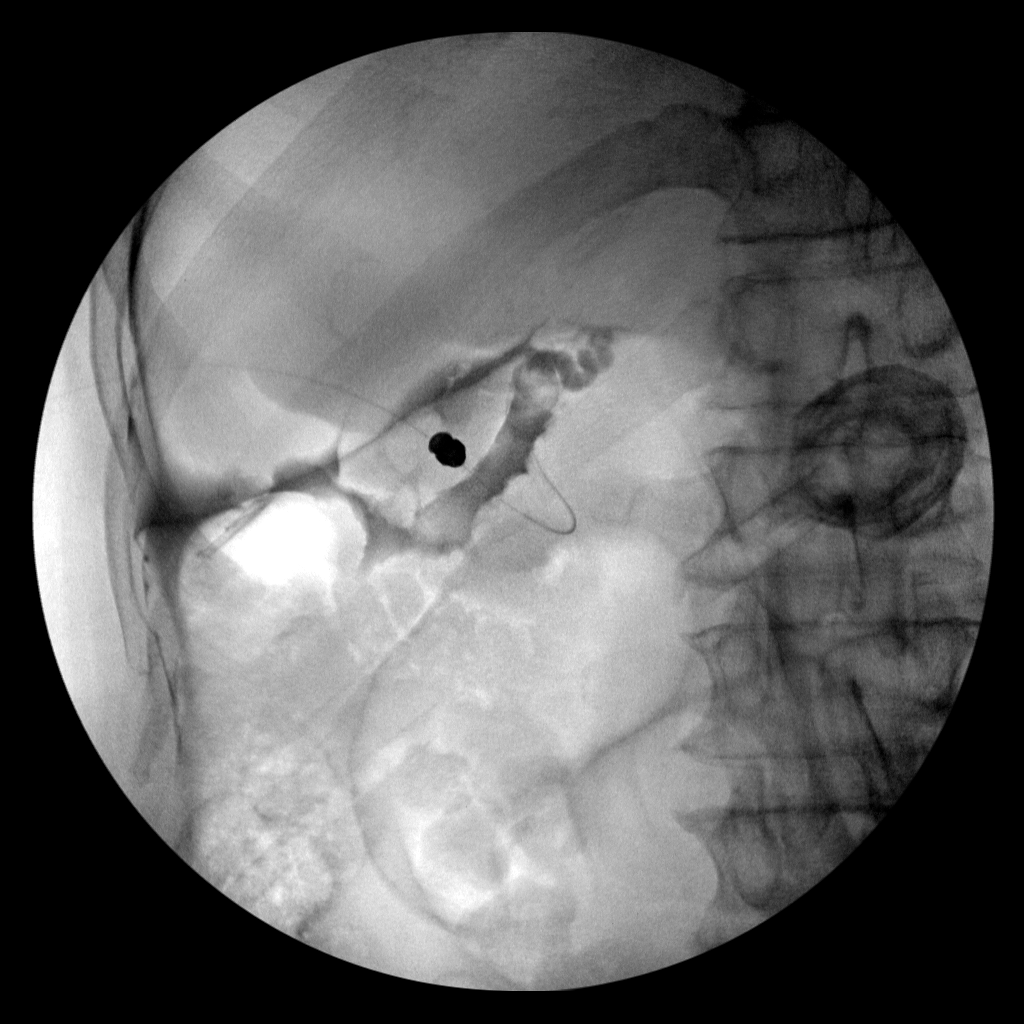

[4 of 4 positions shown; findings below may reference images not displayed]

FINDINGS: Contrast fills the cystic duct. The common bile duct and duodenum do
not opacify with contrast.
IMPRESSION: The above findings are compatible with cystic duct obstruction.

## 2020-01-25 DIAGNOSIS — R202 Paresthesia of skin: Secondary | ICD-10-CM | POA: Diagnosis not present

## 2020-01-25 DIAGNOSIS — G5600 Carpal tunnel syndrome, unspecified upper limb: Secondary | ICD-10-CM | POA: Diagnosis not present

## 2020-01-25 DIAGNOSIS — I6523 Occlusion and stenosis of bilateral carotid arteries: Secondary | ICD-10-CM | POA: Diagnosis not present

## 2020-01-25 DIAGNOSIS — I10 Essential (primary) hypertension: Secondary | ICD-10-CM | POA: Diagnosis not present

## 2020-01-25 DIAGNOSIS — M5030 Other cervical disc degeneration, unspecified cervical region: Secondary | ICD-10-CM | POA: Diagnosis not present

## 2020-01-28 ENCOUNTER — Other Ambulatory Visit: Payer: Self-pay | Admitting: Internal Medicine

## 2020-01-28 DIAGNOSIS — I6523 Occlusion and stenosis of bilateral carotid arteries: Secondary | ICD-10-CM

## 2020-01-28 DIAGNOSIS — R202 Paresthesia of skin: Secondary | ICD-10-CM

## 2020-02-07 ENCOUNTER — Other Ambulatory Visit: Payer: Self-pay | Admitting: Internal Medicine

## 2020-02-07 DIAGNOSIS — R202 Paresthesia of skin: Secondary | ICD-10-CM

## 2020-03-03 ENCOUNTER — Other Ambulatory Visit: Payer: Medicare HMO

## 2020-07-29 DIAGNOSIS — Z125 Encounter for screening for malignant neoplasm of prostate: Secondary | ICD-10-CM | POA: Diagnosis not present

## 2020-07-29 DIAGNOSIS — E785 Hyperlipidemia, unspecified: Secondary | ICD-10-CM | POA: Diagnosis not present

## 2020-08-01 DIAGNOSIS — M25561 Pain in right knee: Secondary | ICD-10-CM | POA: Diagnosis not present

## 2020-08-01 DIAGNOSIS — Z0000 Encounter for general adult medical examination without abnormal findings: Secondary | ICD-10-CM | POA: Diagnosis not present

## 2020-08-01 DIAGNOSIS — M5030 Other cervical disc degeneration, unspecified cervical region: Secondary | ICD-10-CM | POA: Diagnosis not present

## 2020-08-01 DIAGNOSIS — I6523 Occlusion and stenosis of bilateral carotid arteries: Secondary | ICD-10-CM | POA: Diagnosis not present

## 2020-08-01 DIAGNOSIS — E669 Obesity, unspecified: Secondary | ICD-10-CM | POA: Diagnosis not present

## 2020-08-01 DIAGNOSIS — I10 Essential (primary) hypertension: Secondary | ICD-10-CM | POA: Diagnosis not present

## 2020-08-01 DIAGNOSIS — N529 Male erectile dysfunction, unspecified: Secondary | ICD-10-CM | POA: Diagnosis not present

## 2020-08-01 DIAGNOSIS — R82998 Other abnormal findings in urine: Secondary | ICD-10-CM | POA: Diagnosis not present

## 2020-08-01 DIAGNOSIS — E785 Hyperlipidemia, unspecified: Secondary | ICD-10-CM | POA: Diagnosis not present

## 2020-08-01 DIAGNOSIS — D72820 Lymphocytosis (symptomatic): Secondary | ICD-10-CM | POA: Diagnosis not present

## 2020-08-04 ENCOUNTER — Other Ambulatory Visit: Payer: Self-pay | Admitting: Internal Medicine

## 2020-08-04 DIAGNOSIS — M5030 Other cervical disc degeneration, unspecified cervical region: Secondary | ICD-10-CM

## 2020-08-06 DIAGNOSIS — N5201 Erectile dysfunction due to arterial insufficiency: Secondary | ICD-10-CM | POA: Diagnosis not present

## 2020-08-06 DIAGNOSIS — Q5561 Curvature of penis (lateral): Secondary | ICD-10-CM | POA: Diagnosis not present

## 2020-08-06 DIAGNOSIS — R351 Nocturia: Secondary | ICD-10-CM | POA: Diagnosis not present

## 2020-08-23 ENCOUNTER — Other Ambulatory Visit: Payer: Medicare HMO

## 2020-10-07 DIAGNOSIS — Z1212 Encounter for screening for malignant neoplasm of rectum: Secondary | ICD-10-CM | POA: Diagnosis not present

## 2020-10-23 DIAGNOSIS — G5601 Carpal tunnel syndrome, right upper limb: Secondary | ICD-10-CM | POA: Diagnosis not present

## 2020-10-23 DIAGNOSIS — G5602 Carpal tunnel syndrome, left upper limb: Secondary | ICD-10-CM | POA: Diagnosis not present

## 2020-11-12 DIAGNOSIS — G5601 Carpal tunnel syndrome, right upper limb: Secondary | ICD-10-CM | POA: Diagnosis not present

## 2020-11-12 DIAGNOSIS — G5602 Carpal tunnel syndrome, left upper limb: Secondary | ICD-10-CM | POA: Diagnosis not present

## 2020-11-18 DIAGNOSIS — G5603 Carpal tunnel syndrome, bilateral upper limbs: Secondary | ICD-10-CM | POA: Diagnosis not present

## 2020-12-04 DIAGNOSIS — M25562 Pain in left knee: Secondary | ICD-10-CM | POA: Diagnosis not present

## 2020-12-04 DIAGNOSIS — G8929 Other chronic pain: Secondary | ICD-10-CM | POA: Diagnosis not present

## 2020-12-04 DIAGNOSIS — E785 Hyperlipidemia, unspecified: Secondary | ICD-10-CM | POA: Diagnosis not present

## 2020-12-04 DIAGNOSIS — I10 Essential (primary) hypertension: Secondary | ICD-10-CM | POA: Diagnosis not present

## 2020-12-04 DIAGNOSIS — D72829 Elevated white blood cell count, unspecified: Secondary | ICD-10-CM | POA: Diagnosis not present

## 2020-12-11 ENCOUNTER — Telehealth: Payer: Self-pay | Admitting: Hematology and Oncology

## 2020-12-11 NOTE — Telephone Encounter (Signed)
Received a new hem referral from Dr. Philip Aspen for leukocytosis. Mr. Jack Perry has been cld and scheduled to see [name] on 3/9 at 9am. Pt aware to arrive 20 minutes.

## 2020-12-12 DIAGNOSIS — G5601 Carpal tunnel syndrome, right upper limb: Secondary | ICD-10-CM | POA: Diagnosis not present

## 2020-12-12 HISTORY — PX: CARPAL TUNNEL RELEASE: SHX101

## 2020-12-24 ENCOUNTER — Other Ambulatory Visit: Payer: Self-pay

## 2020-12-24 ENCOUNTER — Inpatient Hospital Stay: Payer: Medicare HMO | Attending: Hematology and Oncology | Admitting: Hematology and Oncology

## 2020-12-24 ENCOUNTER — Inpatient Hospital Stay: Payer: Medicare HMO

## 2020-12-24 ENCOUNTER — Encounter: Payer: Self-pay | Admitting: Hematology and Oncology

## 2020-12-24 VITALS — BP 177/94 | HR 79 | Temp 97.5°F | Resp 17 | Ht 75.0 in | Wt 250.7 lb

## 2020-12-24 DIAGNOSIS — I10 Essential (primary) hypertension: Secondary | ICD-10-CM | POA: Diagnosis not present

## 2020-12-24 DIAGNOSIS — Z87891 Personal history of nicotine dependence: Secondary | ICD-10-CM | POA: Insufficient documentation

## 2020-12-24 DIAGNOSIS — D72829 Elevated white blood cell count, unspecified: Secondary | ICD-10-CM | POA: Insufficient documentation

## 2020-12-24 DIAGNOSIS — E785 Hyperlipidemia, unspecified: Secondary | ICD-10-CM | POA: Diagnosis not present

## 2020-12-24 DIAGNOSIS — Z801 Family history of malignant neoplasm of trachea, bronchus and lung: Secondary | ICD-10-CM | POA: Insufficient documentation

## 2020-12-24 DIAGNOSIS — G8929 Other chronic pain: Secondary | ICD-10-CM | POA: Diagnosis not present

## 2020-12-24 DIAGNOSIS — D72820 Lymphocytosis (symptomatic): Secondary | ICD-10-CM | POA: Diagnosis not present

## 2020-12-24 DIAGNOSIS — M25569 Pain in unspecified knee: Secondary | ICD-10-CM | POA: Diagnosis not present

## 2020-12-24 LAB — CMP (CANCER CENTER ONLY)
ALT: 13 U/L (ref 0–44)
AST: 20 U/L (ref 15–41)
Albumin: 4.1 g/dL (ref 3.5–5.0)
Alkaline Phosphatase: 64 U/L (ref 38–126)
Anion gap: 7 (ref 5–15)
BUN: 17 mg/dL (ref 8–23)
CO2: 29 mmol/L (ref 22–32)
Calcium: 9.5 mg/dL (ref 8.9–10.3)
Chloride: 104 mmol/L (ref 98–111)
Creatinine: 1.03 mg/dL (ref 0.61–1.24)
GFR, Estimated: >60 mL/min (ref >60)
Glucose, Bld: 111 mg/dL — ABNORMAL HIGH (ref 70–99)
Potassium: 4.2 mmol/L (ref 3.5–5.1)
Sodium: 140 mmol/L (ref 135–145)
Total Bilirubin: 0.7 mg/dL (ref 0.3–1.2)
Total Protein: 7.1 g/dL (ref 6.5–8.1)

## 2020-12-24 LAB — CBC WITH DIFFERENTIAL (CANCER CENTER ONLY)
Basophils Absolute: 0 10*3/uL (ref 0.0–0.1)
Basophils Relative: 0 %
Eosinophils Absolute: 0 10*3/uL (ref 0.0–0.5)
Eosinophils Relative: 0 %
HCT: 49.5 % (ref 39.0–52.0)
Hemoglobin: 15.9 g/dL (ref 13.0–17.0)
Lymphocytes Relative: 77 %
Lymphs Abs: 10 10*3/uL — ABNORMAL HIGH (ref 0.7–4.0)
MCH: 30.3 pg (ref 26.0–34.0)
MCHC: 32.1 g/dL (ref 30.0–36.0)
MCV: 94.5 fL (ref 80.0–100.0)
Monocytes Absolute: 0.3 10*3/uL (ref 0.1–1.0)
Monocytes Relative: 2 %
Neutro Abs: 2.6 10*3/uL (ref 1.7–7.7)
Neutrophils Relative %: 20 %
Platelet Count: 147 10*3/uL — ABNORMAL LOW (ref 150–400)
RBC: 5.24 MIL/uL (ref 4.22–5.81)
RDW: 13.7 % (ref 11.5–15.5)
WBC Count: 13 10*3/uL — ABNORMAL HIGH (ref 4.0–10.5)
nRBC: 0.2 % (ref 0.0–0.2)

## 2020-12-24 LAB — SURGICAL PATHOLOGY

## 2020-12-24 LAB — LACTATE DEHYDROGENASE: LDH: 150 U/L (ref 98–192)

## 2020-12-24 NOTE — Progress Notes (Signed)
[place] Telephone:(336) [phone]   Fax:(336) [place] NOTE  Patient Care Team: Leanna Battles, MD as PCP - General (Internal Medicine)  Hematological/Oncological History  #Leukocytosis/Lymphocytosis Concerning for CLL 1) 02/07/2021: WBC 10.7, Hgb 16.0, MCV 95.3, Plt 172. ALC 7200 2) 12/04/2020: Lab work from PCP revealed WBC 14.97 K/uL, lymphocytes 11.9 K/uL, lymphocyte % 79.3 3) 12/24/2020: Establish care with Dr. Lorenso Courier  CHIEF COMPLAINTS/PURPOSE OF CONSULTATION:  "Leukocytosis "  HISTORY OF PRESENTING ILLNESS:  Jack Perry 70 y.o. male with medical history significant for HTN, hyperlipidemia, chronic knee pain and ED who presents for evaluation of longstanding lymphocytosis.   On review of the previous records, patient's PCP, Dr. Leanna Battles, obtained labs that revealed elevated WBC 14.97 K/uL, lymphocytes 11.9 K/uL, and lymphocyte % 79.3.  On exam today, patient is doing very well without any significant concerns/symptoms. 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 He has a good appetite and denies any noticeable weight loss. He denies any nausea, vomiting or abdominal pain/fullness. He has regular bowel movements without any hematochezia or melena. Patient denies any fevers, chills, night sweats, shortness of breath, chest pain, cough or other skin changes. He has no other complaints. The remaining 10 point ROS is below.   MEDICAL HISTORY:  Past Medical History:  Diagnosis Date  . Erectile dysfunction   . Hyperlipidemia   . Hypertension   . Peyronie's disease   . Right knee pain     SURGICAL HISTORY: Past Surgical History:  Procedure Laterality Date  . APPENDECTOMY  1970's  . CARPAL TUNNEL RELEASE Right 12/12/2020  . CHOLECYSTECTOMY N/A 02/09/2019   Procedure: LAPAROSCOPIC CHOLECYSTECTOMY WITH INTRAOPERATIVE CHOLANGIOGRAM;  Surgeon: Armandina Gemma, MD;  Location: [facility];  Service: General;  Laterality: N/A;     SOCIAL HISTORY: Social History   Socioeconomic History  . Marital status: Single    Spouse name: Not on file  . Number of children: Not on file  . Years of education: Not on file  . Highest education level: Not on file  Occupational History  . Occupation: Self employed    Comment: Heating and air  Tobacco Use  . Smoking status: Former Research scientist (life sciences)  . Smokeless tobacco: Never Used  . Tobacco comment: Stopped smoking in 1973  Substance and Sexual Activity  . Alcohol use: Not Currently    Comment: Stopped drinking over 14 years  . Drug use: Never  . Sexual activity: Not on file  Other Topics Concern  . Not on file  Social History Narrative  . Not on file   Social Determinants of Health   Financial Resource Strain: Not on file  Food Insecurity: Not on file  Transportation Needs: Not on file  Physical Activity: Not on file  Stress: Not on file  Social Connections: Not on file  Intimate Partner Violence: Not on file    FAMILY HISTORY: Family History  Problem Relation Age of Onset  . Lung cancer Father   . Healthy Sister   . Heart disease Brother     ALLERGIES:  has No Known Allergies.  MEDICATIONS:  Current Outpatient Medications  Medication Sig Dispense Refill  . amLODipine-olmesartan (AZOR) 5-20 MG tablet Take 1 tablet by mouth daily.    . rosuvastatin (CRESTOR) 10 MG tablet Take 10 mg by mouth daily.    . sildenafil (VIAGRA) 100 MG tablet Take 100 mg by mouth daily as needed.     No current facility-administered medications for this visit.  REVIEW OF SYSTEMS:   Constitutional: ( - ) fevers, ( - )  chills , ( - ) night sweats Eyes: ( - ) blurriness of vision, ( - ) double vision, ( - ) watery eyes Ears, nose, mouth, throat, and face: ( - ) mucositis, ( - ) sore throat Respiratory: ( - ) cough, ( - ) dyspnea, ( - ) wheezes Cardiovascular: ( - ) palpitation, ( - ) chest discomfort, ( - ) lower extremity swelling Gastrointestinal:  ( - ) nausea, ( - )  heartburn, ( - ) change in bowel habits Skin: ( - ) abnormal skin rashes Lymphatics: ( - ) new lymphadenopathy, ( - ) easy bruising Neurological: ( - ) numbness, ( - ) tingling, ( - ) new weaknesses Behavioral/Psych: ( - ) mood change, ( - ) new changes  All other systems were reviewed with the patient and are negative.  PHYSICAL EXAMINATION: ECOG PERFORMANCE STATUS: 0 - Asymptomatic  Vitals:   12/24/20 0907  BP: (!) 177/94  Pulse: 79  Resp: 17  Temp: (!) 97.5 F (36.4 C)  SpO2: 100%   Filed Weights   12/24/20 0907  Weight: 250 lb 11.2 oz (113.7 kg)    GENERAL: well appearing african american gentleman in NAD  SKIN: skin color, texture, turgor are normal, no rashes or significant lesions EYES: conjunctiva are pink and non-injected, sclera clear OROPHARYNX: no exudate, no erythema; lips, buccal mucosa, and tongue normal  NECK: supple, non-tender LYMPH:  no palpable lymphadenopathy in the cervical, axillary or supraclavicular lymph nodes.  LUNGS: clear to auscultation and percussion with normal breathing effort HEART: regular rate & rhythm and no murmurs and no lower extremity edema ABDOMEN: soft, non-tender, non-distended, normal bowel sounds Musculoskeletal: no cyanosis of digits and no clubbing  PSYCH: alert & oriented x 3, fluent speech NEURO: no focal motor/sensory deficits  LABORATORY DATA:  I have reviewed the data as listed CBC Latest Ref Rng & Units 02/10/2019 02/08/2019  WBC 4.0 - 10.5 K/uL 15.0(H) 10.7(H)  Hemoglobin 13.0 - 17.0 g/dL 15.1 16.0  Hematocrit 39.0 - 52.0 % 46.5 50.3  Platelets 150 - 400 K/uL 171 172    CMP Latest Ref Rng & Units 02/10/2019 02/08/2019  Glucose 70 - 99 mg/dL 147(H) 93  BUN 8 - 23 mg/dL 15 16  Creatinine 0.61 - 1.24 mg/dL 0.98 0.94  Sodium 135 - 145 mmol/L 135 138  Potassium 3.5 - 5.1 mmol/L 4.3 4.2  Chloride 98 - 111 mmol/L 101 104  CO2 22 - 32 mmol/L 24 25  Calcium 8.9 - 10.3 mg/dL 8.9 9.4  Total Protein 6.5 - 8.1 g/dL 6.9 7.7   Total Bilirubin 0.3 - 1.2 mg/dL 1.2 0.9  Alkaline Phos 38 - 126 U/L 60 71  AST 15 - 41 U/L 94(H) 23  ALT 0 - 44 U/L 46(H) 14    RADIOGRAPHIC STUDIES: No images were reviewed during this visit.  No results found.  ASSESSMENT & PLAN [name] is a 70 y.o. male presenting to the clinic for evaluation for leukocytosis. Patient is feeling well with no concerning symptoms. Plan to obtain labs for further workup including CBC with diff, CMP, LDH, Flow cytometry, CLL prognostic panel.   #Leukocytosis: --Need labs including CBC with diff, CMP, LDH, Flow cytometry, CLL prognostic panel.  --RTC in 3 weeks with labs  Orders Placed This Encounter  Procedures  . CBC with Differential (Cancer Center Only)    Standing Status:   Future    Number  of Occurrences:   1    Standing Expiration Date:   12/24/2021  . CMP ([place] only)    Standing Status:   Future    Number of Occurrences:   1    Standing Expiration Date:   12/24/2021  . Lactate dehydrogenase (LDH)    Standing Status:   Future    Number of Occurrences:   1    Standing Expiration Date:   12/24/2021  . FISH, CLL Prognostic Panel    Standing Status:   Future    Number of Occurrences:   1    Standing Expiration Date:   12/24/2021  . Flow Cytometry, Peripheral Blood (Oncology)    Standing Status:   Future    Number of Occurrences:   1    Standing Expiration Date:   12/24/2021    All questions were answered. The patient knows to call the clinic with any problems, questions or concerns.  A total of more than 60 minutes were spent on this encounter and over half of that time was spent on counseling and coordination of care as outlined above.   Ledell Peoples, MD Department of Hematology/Oncology [place] at [facility] Phone: [phone] Pager: [phone] Email: [email or web address]  12/24/2020 10:01 AM

## 2020-12-25 ENCOUNTER — Telehealth: Payer: Self-pay | Admitting: Hematology and Oncology

## 2020-12-25 LAB — FLOW CYTOMETRY

## 2020-12-25 NOTE — Telephone Encounter (Signed)
Scheduled per los. Called and spoke with patient. Confirmed appt 

## 2021-01-01 LAB — FISH,CLL PROGNOSTIC PANEL

## 2021-01-02 ENCOUNTER — Telehealth: Payer: Self-pay | Admitting: Hematology and Oncology

## 2021-01-02 ENCOUNTER — Telehealth: Payer: Self-pay | Admitting: *Deleted

## 2021-01-02 NOTE — Telephone Encounter (Signed)
Called Mr. Jack Perry to discuss the results of his blood work.  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  He does not have any anemia or major thrombocytopenia at this time.  Given that he does not require urgent treatment.  I discussed with him that he has this disease and the treatment is not required at this time.  We will see him back on 01/14/2021 in order to discuss his disease and plans moving forward in greater detail.  He voices understanding of this plan.  Ledell Peoples, MD Department of Hematology/Oncology [place] at [facility] Phone: [phone] Pager: [phone] Email: [email or web address]

## 2021-01-02 NOTE — Telephone Encounter (Signed)
Received vm message from patient regarding lab results. Pt is requesting a call back today.  Thank you

## 2021-01-14 ENCOUNTER — Other Ambulatory Visit: Payer: Self-pay | Admitting: Hematology and Oncology

## 2021-01-14 ENCOUNTER — Other Ambulatory Visit: Payer: Self-pay

## 2021-01-14 ENCOUNTER — Encounter: Payer: Self-pay | Admitting: Hematology and Oncology

## 2021-01-14 ENCOUNTER — Inpatient Hospital Stay: Payer: Medicare HMO | Admitting: Hematology and Oncology

## 2021-01-14 ENCOUNTER — Inpatient Hospital Stay: Payer: Medicare HMO

## 2021-01-14 VITALS — BP 143/78 | HR 67 | Temp 97.5°F | Resp 16 | Ht 75.0 in | Wt 248.3 lb

## 2021-01-14 DIAGNOSIS — C9110 Chronic lymphocytic leukemia of B-cell type not having achieved remission: Secondary | ICD-10-CM | POA: Diagnosis not present

## 2021-01-14 DIAGNOSIS — I10 Essential (primary) hypertension: Secondary | ICD-10-CM

## 2021-01-14 DIAGNOSIS — G8929 Other chronic pain: Secondary | ICD-10-CM | POA: Diagnosis not present

## 2021-01-14 DIAGNOSIS — Z87891 Personal history of nicotine dependence: Secondary | ICD-10-CM

## 2021-01-14 DIAGNOSIS — D72829 Elevated white blood cell count, unspecified: Secondary | ICD-10-CM | POA: Diagnosis not present

## 2021-01-14 DIAGNOSIS — M25561 Pain in right knee: Secondary | ICD-10-CM

## 2021-01-14 DIAGNOSIS — Z801 Family history of malignant neoplasm of trachea, bronchus and lung: Secondary | ICD-10-CM

## 2021-01-14 DIAGNOSIS — E785 Hyperlipidemia, unspecified: Secondary | ICD-10-CM | POA: Diagnosis not present

## 2021-01-14 DIAGNOSIS — M25569 Pain in unspecified knee: Secondary | ICD-10-CM | POA: Diagnosis not present

## 2021-01-14 LAB — CMP (CANCER CENTER ONLY)
ALT: 21 U/L (ref 0–44)
AST: 59 U/L — ABNORMAL HIGH (ref 15–41)
Albumin: 4.1 g/dL (ref 3.5–5.0)
Alkaline Phosphatase: 62 U/L (ref 38–126)
Anion gap: 9 (ref 5–15)
BUN: 23 mg/dL (ref 8–23)
CO2: 27 mmol/L (ref 22–32)
Calcium: 9 mg/dL (ref 8.9–10.3)
Chloride: 104 mmol/L (ref 98–111)
Creatinine: 1.05 mg/dL (ref 0.61–1.24)
GFR, Estimated: >60 mL/min (ref >60)
Glucose, Bld: 148 mg/dL — ABNORMAL HIGH (ref 70–99)
Potassium: 3.6 mmol/L (ref 3.5–5.1)
Sodium: 140 mmol/L (ref 135–145)
Total Bilirubin: 0.8 mg/dL (ref 0.3–1.2)
Total Protein: 6.9 g/dL (ref 6.5–8.1)

## 2021-01-14 LAB — CBC WITH DIFFERENTIAL (CANCER CENTER ONLY)
Abs Immature Granulocytes: 0.02 10*3/uL (ref 0.00–0.07)
Basophils Absolute: 0 10*3/uL (ref 0.0–0.1)
Basophils Relative: 0 %
Eosinophils Absolute: 0.1 10*3/uL (ref 0.0–0.5)
Eosinophils Relative: 1 %
HCT: 48.7 % (ref 39.0–52.0)
Hemoglobin: 15.8 g/dL (ref 13.0–17.0)
Immature Granulocytes: 0 %
Lymphocytes Relative: 77 %
Lymphs Abs: 10.2 10*3/uL — ABNORMAL HIGH (ref 0.7–4.0)
MCH: 31.1 pg (ref 26.0–34.0)
MCHC: 32.4 g/dL (ref 30.0–36.0)
MCV: 95.9 fL (ref 80.0–100.0)
Monocytes Absolute: 0.2 10*3/uL (ref 0.1–1.0)
Monocytes Relative: 2 %
Neutro Abs: 2.6 10*3/uL (ref 1.7–7.7)
Neutrophils Relative %: 20 %
Platelet Count: 119 10*3/uL — ABNORMAL LOW (ref 150–400)
RBC: 5.08 MIL/uL (ref 4.22–5.81)
RDW: 13.8 % (ref 11.5–15.5)
WBC Count: 13.1 10*3/uL — ABNORMAL HIGH (ref 4.0–10.5)
nRBC: 0 % (ref 0.0–0.2)

## 2021-01-14 LAB — LACTATE DEHYDROGENASE: LDH: 210 U/L — ABNORMAL HIGH (ref 98–192)

## 2021-01-14 NOTE — Progress Notes (Signed)
[place] [phone]   Fax:(336) [phone]  PROGRESS NOTE  Patient Care Team: Leanna Battles, MD as PCP - General (Internal Medicine)  Hematological/Oncological History  #Chronic Lymphocytic Leukemia 1) 02/07/2021: WBC 10.7, Hgb 16.0, MCV 95.3, Plt 172. ALC 7200 2) 12/04/2020: Lab work from PCP revealed WBC 14.97 K/uL, lymphocytes 11.9 K/uL, lymphocyte % 79.3 3) 12/24/2020: Establish care with Dr. Lorenso Courier -Flow Cytometry: Monoclonal B-cell population consistent with chronic lymphocytic leukemia.  -FISH: del(13q) identified  HISTORY OF PRESENTING ILLNESS:  Jack Perry 70 y.o. male with medical history significant for HTN, hyperlipidemia, chronic knee pain and ED who presents for a follow up after recent diagnosis of CLL.   On exam today, patient continues to do well without any new changes to his health. 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 He has a good appetite and denies any noticeable weight loss. He denies any nausea, vomiting or abdominal pain/fullness. He has regular bowel movements without any hematochezia or melena. Patient denies any fevers, chills, night sweats, shortness of breath, chest pain, cough or other skin changes. He has no other complaints. The remaining 10 point ROS is below.   MEDICAL HISTORY:  Past Medical History:  Diagnosis Date  . Erectile dysfunction   . Hyperlipidemia   . Hypertension   . Peyronie's disease   . Right knee pain     SURGICAL HISTORY: Past Surgical History:  Procedure Laterality Date  . APPENDECTOMY  1970's  . CARPAL TUNNEL RELEASE Right 12/12/2020  . CHOLECYSTECTOMY N/A 02/09/2019   Procedure: LAPAROSCOPIC CHOLECYSTECTOMY WITH INTRAOPERATIVE CHOLANGIOGRAM;  Surgeon: Armandina Gemma, MD;  Location: [facility];  Service: General;  Laterality: N/A;    SOCIAL HISTORY: Social History   Socioeconomic History  . Marital status: Single    Spouse name: Not on file  . Number of children: Not  on file  . Years of education: Not on file  . Highest education level: Not on file  Occupational History  . Occupation: Self employed    Comment: Heating and air  Tobacco Use  . Smoking status: Former Research scientist (life sciences)  . Smokeless tobacco: Never Used  . Tobacco comment: Stopped smoking in 1973  Substance and Sexual Activity  . Alcohol use: Not Currently    Comment: Stopped drinking over 14 years  . Drug use: Never  . Sexual activity: Not on file  Other Topics Concern  . Not on file  Social History Narrative  . Not on file   Social Determinants of Health   Financial Resource Strain: Not on file  Food Insecurity: Not on file  Transportation Needs: Not on file  Physical Activity: Not on file  Stress: Not on file  Social Connections: Not on file  Intimate Partner Violence: Not on file    FAMILY HISTORY: Family History  Problem Relation Age of Onset  . Lung cancer Father   . Healthy Sister   . Heart disease Brother     ALLERGIES:  has No Known Allergies.  MEDICATIONS:  Current Outpatient Medications  Medication Sig Dispense Refill  . amLODipine-olmesartan (AZOR) 5-20 MG tablet Take 1 tablet by mouth daily.    . rosuvastatin (CRESTOR) 10 MG tablet Take 10 mg by mouth daily.    . sildenafil (VIAGRA) 100 MG tablet Take 100 mg by mouth daily as needed.     No current facility-administered medications for this visit.    REVIEW OF SYSTEMS:   Constitutional: ( - ) fevers, ( - )  chills , ( - ) night sweats Eyes: ( - ) blurriness of vision, ( - ) double vision, ( - ) watery eyes Ears, nose, mouth, throat, and face: ( - ) mucositis, ( - ) sore throat Respiratory: ( - ) cough, ( - ) dyspnea, ( - ) wheezes Cardiovascular: ( - ) palpitation, ( - ) chest discomfort, ( - ) lower extremity swelling Gastrointestinal:  ( - ) nausea, ( - ) heartburn, ( - ) change in bowel habits Skin: ( - ) abnormal skin rashes Lymphatics: ( - ) new lymphadenopathy, ( - ) easy bruising Neurological: ( - )  numbness, ( - ) tingling, ( - ) new weaknesses Behavioral/Psych: ( - ) mood change, ( - ) new changes  All other systems were reviewed with the patient and are negative.  PHYSICAL EXAMINATION: ECOG PERFORMANCE STATUS: 0 - Asymptomatic  Vitals:   01/14/21 1515  BP: (!) 143/78  Pulse: 67  Resp: 16  Temp: (!) 97.5 F (36.4 C)  SpO2: 100%   Filed Weights   01/14/21 1515  Weight: 248 lb 4.8 oz (112.6 kg)    GENERAL: well appearing african american gentleman in NAD  SKIN: skin color, texture, turgor are normal, no rashes or significant lesions EYES: conjunctiva are pink and non-injected, sclera clear OROPHARYNX: no exudate, no erythema; lips, buccal mucosa, and tongue normal  NECK: supple, non-tender LYMPH:  no palpable lymphadenopathy in the cervical, axillary or supraclavicular lymph nodes.  LUNGS: clear to auscultation and percussion with normal breathing effort HEART: regular rate & rhythm and no murmurs and no lower extremity edema ABDOMEN: soft, non-tender, non-distended, normal bowel sounds Musculoskeletal: no cyanosis of digits and no clubbing  PSYCH: alert & oriented x 3, fluent speech NEURO: no focal motor/sensory deficits  LABORATORY DATA:  I have reviewed the data as listed CBC Latest Ref Rng & Units 01/14/2021 12/24/2020 02/10/2019  WBC 4.0 - 10.5 K/uL 13.1(H) 13.0(H) 15.0(H)  Hemoglobin 13.0 - 17.0 g/dL 15.8 15.9 15.1  Hematocrit 39.0 - 52.0 % 48.7 49.5 46.5  Platelets 150 - 400 K/uL 119(L) 147(L) 171    CMP Latest Ref Rng & Units 01/14/2021 12/24/2020 02/10/2019  Glucose 70 - 99 mg/dL 148(H) 111(H) 147(H)  BUN 8 - 23 mg/dL 23 17 15   Creatinine 0.61 - 1.24 mg/dL 1.05 1.03 0.98  Sodium 135 - 145 mmol/L 140 140 135  Potassium 3.5 - 5.1 mmol/L 3.6 4.2 4.3  Chloride 98 - 111 mmol/L 104 104 101  CO2 22 - 32 mmol/L 27 29 24   Calcium 8.9 - 10.3 mg/dL 9.0 9.5 8.9  Total Protein 6.5 - 8.1 g/dL 6.9 7.1 6.9  Total Bilirubin 0.3 - 1.2 mg/dL 0.8 0.7 1.2  Alkaline Phos 38 -  126 U/L 62 64 60  AST 15 - 41 U/L 59(H) 20 94(H)  ALT 0 - 44 U/L 21 13 46(H)    RADIOGRAPHIC STUDIES: No images were reviewed during this visit.  No results found.  ASSESSMENT & PLAN Jack Perry is a 70 y.o. male presents for a follow up after recent diagnosis of CLL. Reviewed FISH results that indicate a del(13q) which is associated with a favorable prognostic outcome. Today's labs shows show stable WBC at 13.1 and worsening thrombocytopenia at 119K. Recommendation is to proceed with close observation and return to the clinic in 3 months with repeat labs. In the interim we will check additional prognostic factors including IGHV and ZAP 70 mutational status.   #Chronic Lymphocytic Leukemia: --Flow Cytometry:  Monoclonal B-cell population consistent with chronic lymphocytic leukemia.  --FISH: del(13q) identified --Pending IGHV and ZAP 70 mutational status --Labs from today shows stable WBC 13.1 and Lymphs Abs 10.2. Platelets decreased slightly to 119K. Will consider splenic US if platelets continue to decrease.  --Plan to proceed with close observation.  --RTC in 3 months with labs  No orders of the defined types were placed in this encounter.   All questions were answered. The patient knows to call the clinic with any problems, questions or concerns.  A total of more than 60 minutes were spent on this encounter and over half of that time was spent on counseling and coordination of care as outlined above.   Ledell Peoples, MD Department of Hematology/Oncology [place] at [facility] Phone: [phone] Pager: [phone] Email: [email or web address]  01/15/2021 4:19 PM

## 2021-01-15 ENCOUNTER — Encounter: Payer: Self-pay | Admitting: Hematology and Oncology

## 2021-01-15 LAB — MISC LABCORP TEST (SEND OUT): LabCorp test name: 113753

## 2021-01-16 ENCOUNTER — Telehealth: Payer: Self-pay | Admitting: Hematology and Oncology

## 2021-01-16 NOTE — Telephone Encounter (Signed)
Scheduled per los. Called and spoke with patient. Confirmed appt 

## 2021-01-20 LAB — IGVH SOMATIC HYPERMUTATION

## 2021-02-19 ENCOUNTER — Encounter: Payer: Self-pay | Admitting: Hematology and Oncology

## 2021-02-19 ENCOUNTER — Encounter: Payer: Self-pay | Admitting: Hematology

## 2021-02-23 LAB — ZAP-70

## 2021-04-06 DIAGNOSIS — I10 Essential (primary) hypertension: Secondary | ICD-10-CM | POA: Diagnosis not present

## 2021-04-06 DIAGNOSIS — M79641 Pain in right hand: Secondary | ICD-10-CM | POA: Diagnosis not present

## 2021-04-06 DIAGNOSIS — C9110 Chronic lymphocytic leukemia of B-cell type not having achieved remission: Secondary | ICD-10-CM | POA: Diagnosis not present

## 2021-04-09 DIAGNOSIS — M65331 Trigger finger, right middle finger: Secondary | ICD-10-CM | POA: Diagnosis not present

## 2021-04-09 DIAGNOSIS — G5601 Carpal tunnel syndrome, right upper limb: Secondary | ICD-10-CM | POA: Diagnosis not present

## 2021-04-16 ENCOUNTER — Inpatient Hospital Stay: Payer: Medicare HMO

## 2021-04-16 ENCOUNTER — Other Ambulatory Visit: Payer: Self-pay

## 2021-04-16 ENCOUNTER — Other Ambulatory Visit: Payer: Self-pay | Admitting: Hematology and Oncology

## 2021-04-16 ENCOUNTER — Inpatient Hospital Stay: Payer: Medicare HMO | Attending: Hematology and Oncology | Admitting: Hematology and Oncology

## 2021-04-16 VITALS — BP 151/80 | HR 65 | Temp 98.8°F | Resp 18 | Ht 75.0 in | Wt 253.4 lb

## 2021-04-16 DIAGNOSIS — D72820 Lymphocytosis (symptomatic): Secondary | ICD-10-CM | POA: Diagnosis not present

## 2021-04-16 DIAGNOSIS — D696 Thrombocytopenia, unspecified: Secondary | ICD-10-CM | POA: Insufficient documentation

## 2021-04-16 DIAGNOSIS — Z801 Family history of malignant neoplasm of trachea, bronchus and lung: Secondary | ICD-10-CM | POA: Diagnosis not present

## 2021-04-16 DIAGNOSIS — C9110 Chronic lymphocytic leukemia of B-cell type not having achieved remission: Secondary | ICD-10-CM

## 2021-04-16 DIAGNOSIS — Z87891 Personal history of nicotine dependence: Secondary | ICD-10-CM | POA: Diagnosis not present

## 2021-04-16 LAB — CBC WITH DIFFERENTIAL (CANCER CENTER ONLY)
Abs Immature Granulocytes: 0.02 10*3/uL (ref 0.00–0.07)
Basophils Absolute: 0.1 10*3/uL (ref 0.0–0.1)
Basophils Relative: 0 %
Eosinophils Absolute: 0.1 10*3/uL (ref 0.0–0.5)
Eosinophils Relative: 1 %
HCT: 45.2 % (ref 39.0–52.0)
Hemoglobin: 15 g/dL (ref 13.0–17.0)
Immature Granulocytes: 0 %
Lymphocytes Relative: 78 %
Lymphs Abs: 10.5 10*3/uL — ABNORMAL HIGH (ref 0.7–4.0)
MCH: 32 pg (ref 26.0–34.0)
MCHC: 33.2 g/dL (ref 30.0–36.0)
MCV: 96.4 fL (ref 80.0–100.0)
Monocytes Absolute: 0.3 10*3/uL (ref 0.1–1.0)
Monocytes Relative: 2 %
Neutro Abs: 2.6 10*3/uL (ref 1.7–7.7)
Neutrophils Relative %: 19 %
Platelet Count: 116 10*3/uL — ABNORMAL LOW (ref 150–400)
RBC: 4.69 MIL/uL (ref 4.22–5.81)
RDW: 13.6 % (ref 11.5–15.5)
WBC Count: 13.6 10*3/uL — ABNORMAL HIGH (ref 4.0–10.5)
nRBC: 0 % (ref 0.0–0.2)

## 2021-04-16 LAB — CMP (CANCER CENTER ONLY)
ALT: 13 U/L (ref 0–44)
AST: 22 U/L (ref 15–41)
Albumin: 3.7 g/dL (ref 3.5–5.0)
Alkaline Phosphatase: 67 U/L (ref 38–126)
Anion gap: 7 (ref 5–15)
BUN: 22 mg/dL (ref 8–23)
CO2: 28 mmol/L (ref 22–32)
Calcium: 9.3 mg/dL (ref 8.9–10.3)
Chloride: 104 mmol/L (ref 98–111)
Creatinine: 1.06 mg/dL (ref 0.61–1.24)
GFR, Estimated: >60 mL/min
Glucose, Bld: 97 mg/dL (ref 70–99)
Potassium: 4 mmol/L (ref 3.5–5.1)
Sodium: 139 mmol/L (ref 135–145)
Total Bilirubin: 0.5 mg/dL (ref 0.3–1.2)
Total Protein: 6.6 g/dL (ref 6.5–8.1)

## 2021-04-16 LAB — LACTATE DEHYDROGENASE: LDH: 154 U/L (ref 98–192)

## 2021-04-16 NOTE — Progress Notes (Signed)
[place] [phone]   Fax:(336) [phone]  PROGRESS NOTE  Patient Care Team: Leanna Battles, MD as PCP - General (Internal Medicine)  Hematological/Oncological History  #Chronic Lymphocytic Leukemia, in observation 1) 02/07/2021: WBC 10.7, Hgb 16.0, MCV 95.3, Plt 172. ALC 7200 2) 12/04/2020: Lab work from PCP revealed WBC 14.97 K/uL, lymphocytes 11.9 K/uL, lymphocyte % 79.3 3) 12/24/2020: Establish care with Dr. Lorenso Courier -Flow Cytometry: Monoclonal B-cell population consistent with chronic lymphocytic leukemia. FISH: del(13q) identified. 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   HISTORY OF PRESENTING ILLNESS:  Jack Perry 70 y.o. male with medical history significant for CLL who presents for a follow up visit. He was last seen on 01/14/2021. In the interim since his last visit his mutational panel showed an IgHV mutation and was negative for ZAP-70.    On exam today [name] reports he has been well overall in the interim since her last visit.  He reports that his energy is good and his weight has been stable.  He notes that he has had a good appetite and he has developed no new symptoms. He denies any nausea, vomiting or abdominal pain/fullness. He has regular bowel movements without any hematochezia or melena. Patient denies any fevers, chills, night sweats, shortness of breath, chest pain, cough or other skin changes. He has no other complaints. The remaining 10 point ROS is below.   MEDICAL HISTORY:  Past Medical History:  Diagnosis Date   Erectile dysfunction    Hyperlipidemia    Hypertension    Peyronie's disease    Right knee pain     SURGICAL HISTORY: Past Surgical History:  Procedure Laterality Date   APPENDECTOMY  1970's   CARPAL TUNNEL RELEASE Right 12/12/2020   CHOLECYSTECTOMY N/A 02/09/2019   Procedure: LAPAROSCOPIC CHOLECYSTECTOMY WITH INTRAOPERATIVE CHOLANGIOGRAM;  Surgeon: Armandina Gemma, MD;  Location: [facility];  Service: General;  Laterality:  N/A;    SOCIAL HISTORY: Social History   Socioeconomic History   Marital status: Single    Spouse name: Not on file   Number of children: Not on file   Years of education: Not on file   Highest education level: Not on file  Occupational History   Occupation: Self employed    Comment: Heating and air  Tobacco Use   Smoking status: Former    Pack years: 0.00   Smokeless tobacco: Never   Tobacco comments:    Stopped smoking in 1973  Substance and Sexual Activity   Alcohol use: Not Currently    Comment: Stopped drinking over 14 years   Drug use: Never   Sexual activity: Not on file  Other Topics Concern   Not on file  Social History Narrative   Not on file   Social Determinants of Health   Financial Resource Strain: Not on file  Food Insecurity: Not on file  Transportation Needs: Not on file  Physical Activity: Not on file  Stress: Not on file  Social Connections: Not on file  Intimate Partner Violence: Not on file    FAMILY HISTORY: Family History  Problem Relation Age of Onset   Lung cancer Father    Healthy Sister    Heart disease Brother     ALLERGIES:  has No Known Allergies.  MEDICATIONS:  Current Outpatient Medications  Medication Sig Dispense Refill   amLODipine-olmesartan (AZOR) 5-20 MG tablet Take 1 tablet by mouth daily.     gabapentin (NEURONTIN) 300 MG capsule Take by mouth.     rosuvastatin (CRESTOR) 10  MG tablet Take 10 mg by mouth daily.     sildenafil (VIAGRA) 100 MG tablet Take 100 mg by mouth daily as needed.     No current facility-administered medications for this visit.    REVIEW OF SYSTEMS:   Constitutional: ( - ) fevers, ( - )  chills , ( - ) night sweats Eyes: ( - ) blurriness of vision, ( - ) double vision, ( - ) watery eyes Ears, nose, mouth, throat, and face: ( - ) mucositis, ( - ) sore throat Respiratory: ( - ) cough, ( - ) dyspnea, ( - ) wheezes Cardiovascular: ( - ) palpitation, ( - ) chest discomfort, ( - ) lower  extremity swelling Gastrointestinal:  ( - ) nausea, ( - ) heartburn, ( - ) change in bowel habits Skin: ( - ) abnormal skin rashes Lymphatics: ( - ) new lymphadenopathy, ( - ) easy bruising Neurological: ( - ) numbness, ( - ) tingling, ( - ) new weaknesses Behavioral/Psych: ( - ) mood change, ( - ) new changes  All other systems were reviewed with the patient and are negative.  PHYSICAL EXAMINATION: ECOG PERFORMANCE STATUS: 0 - Asymptomatic  Vitals:   04/16/21 0932  BP: (!) 151/80  Pulse: 65  Resp: 18  Temp: 98.8 F (37.1 C)  SpO2: 100%    Filed Weights   04/16/21 0932  Weight: 253 lb 6.4 oz (114.9 kg)     GENERAL: well appearing african american gentleman in NAD  SKIN: skin color, texture, turgor are normal, no rashes or significant lesions EYES: conjunctiva are pink and non-injected, sclera clear NECK: supple, non-tender LYMPH:  no palpable lymphadenopathy in the cervical, axillary or supraclavicular lymph nodes.  LUNGS: clear to auscultation and percussion with normal breathing effort HEART: regular rate & rhythm and no murmurs and no lower extremity edema PSYCH: alert & oriented x 3, fluent speech NEURO: no focal motor/sensory deficits  LABORATORY DATA:  I have reviewed the data as listed CBC Latest Ref Rng & Units 04/16/2021 01/14/2021 12/24/2020  WBC 4.0 - 10.5 K/uL 13.6(H) 13.1(H) 13.0(H)  Hemoglobin 13.0 - 17.0 g/dL 15.0 15.8 15.9  Hematocrit 39.0 - 52.0 % 45.2 48.7 49.5  Platelets 150 - 400 K/uL 116(L) 119(L) 147(L)    CMP Latest Ref Rng & Units 04/16/2021 01/14/2021 12/24/2020  Glucose 70 - 99 mg/dL 97 148(H) 111(H)  BUN 8 - 23 mg/dL 22 23 17   Creatinine 0.61 - 1.24 mg/dL 1.06 1.05 1.03  Sodium 135 - 145 mmol/L 139 140 140  Potassium 3.5 - 5.1 mmol/L 4.0 3.6 4.2  Chloride 98 - 111 mmol/L 104 104 104  CO2 22 - 32 mmol/L 28 27 29   Calcium 8.9 - 10.3 mg/dL 9.3 9.0 9.5  Total Protein 6.5 - 8.1 g/dL 6.6 6.9 7.1  Total Bilirubin 0.3 - 1.2 mg/dL 0.5 0.8 0.7   Alkaline Phos 38 - 126 U/L 67 62 64  AST 15 - 41 U/L 22 59(H) 20  ALT 0 - 44 U/L 13 21 13     RADIOGRAPHIC STUDIES: No images were reviewed during this visit.  No results found.  ASSESSMENT & PLAN Amed Hemric is a 70 y.o. male presents for a follow up after recent diagnosis of CLL. Reviewed FISH results that indicate a del(13q) which is associated with a favorable prognostic outcome. Today's labs shows show stable WBC and thrombocytopenia. Recommendation is to proceed with close observation and return to the clinic in 6 months with repeat labs. In the  interim we will check additional prognostic factors including IGHV and ZAP 70 mutational status.   #Chronic Lymphocytic Leukemia: #Thrombocytopenia #Lymphocytosis --Flow Cytometry: Monoclonal B-cell population consistent with chronic lymphocytic leukemia.  --FISH: del(13q) identified --Postiive for IgHV mutation and negative ZAP 70  --Labs from today shows stable WBC 13.6 and Lymphs Abs 10.5 Platelets 116. Will order a splenic US today to assess the spleen size.  --Plan to proceed with close observation.  --RTC in 6 months with labs  Orders Placed This Encounter  Procedures   US Abdomen Limited    Standing Status:   Future    Standing Expiration Date:   04/16/2022    Order Specific Question:   Reason for Exam (SYMPTOM  OR DIAGNOSIS REQUIRED)    Answer:   assess spleen size/ assess for liver disease    Order Specific Question:   Preferred imaging location?    Answer:   [facility]     All questions were answered. The patient knows to call the clinic with any problems, questions or concerns.  A total of more than 30 minutes were spent on this encounter and over half of that time was spent on counseling and coordination of care as outlined above.   Ledell Peoples, MD Department of Hematology/Oncology [place] at [facility] Phone: [phone] Pager: [phone] Email:  [email or web address]  04/16/2021 10:36 AM

## 2021-04-22 ENCOUNTER — Telehealth: Payer: Self-pay | Admitting: Hematology and Oncology

## 2021-04-22 NOTE — Telephone Encounter (Signed)
Sch per 06/30 los, left message

## 2021-04-24 ENCOUNTER — Other Ambulatory Visit: Payer: Self-pay | Admitting: Hematology and Oncology

## 2021-04-24 ENCOUNTER — Other Ambulatory Visit: Payer: Self-pay

## 2021-04-24 ENCOUNTER — Ambulatory Visit (HOSPITAL_COMMUNITY)
Admission: RE | Admit: 2021-04-24 | Discharge: 2021-04-24 | Disposition: A | Discharge status: Home / Self Care | Payer: Medicare HMO | Source: Ambulatory Visit | Attending: Hematology and Oncology | Admitting: Hematology and Oncology

## 2021-04-24 DIAGNOSIS — C9110 Chronic lymphocytic leukemia of B-cell type not having achieved remission: Secondary | ICD-10-CM | POA: Diagnosis not present

## 2021-04-24 DIAGNOSIS — Z9049 Acquired absence of other specified parts of digestive tract: Secondary | ICD-10-CM | POA: Diagnosis not present

## 2021-04-24 DIAGNOSIS — K760 Fatty (change of) liver, not elsewhere classified: Secondary | ICD-10-CM | POA: Diagnosis not present

## 2021-05-02 ENCOUNTER — Other Ambulatory Visit: Payer: Self-pay

## 2021-05-02 ENCOUNTER — Emergency Department (HOSPITAL_COMMUNITY)
Admission: EM | Admit: 2021-05-02 | Discharge: 2021-05-02 | Disposition: A | Discharge status: Home / Self Care | Payer: Medicare HMO | Attending: Emergency Medicine | Admitting: Emergency Medicine

## 2021-05-02 DIAGNOSIS — I10 Essential (primary) hypertension: Secondary | ICD-10-CM | POA: Diagnosis not present

## 2021-05-02 DIAGNOSIS — Z87891 Personal history of nicotine dependence: Secondary | ICD-10-CM | POA: Diagnosis not present

## 2021-05-02 DIAGNOSIS — M79641 Pain in right hand: Secondary | ICD-10-CM | POA: Diagnosis not present

## 2021-05-02 DIAGNOSIS — Z79899 Other long term (current) drug therapy: Secondary | ICD-10-CM | POA: Insufficient documentation

## 2021-05-02 NOTE — ED Triage Notes (Signed)
Pt came in with c/o R hand numbness and paresthesia. Pt had carpel tunnel surgery in Feb. States it has been ongoing since, but recently it has gotten worse.

## 2021-05-02 NOTE — Discharge Instructions (Addendum)
Please follow-up with your hand surgeon.  Please wear the splints.   Try ice massage.

## 2021-05-02 NOTE — ED Provider Notes (Signed)
[facility] DEPT Provider Note   CSN: [id number] Arrival date & time: 05/02/21  0217     History Chief Complaint  Patient presents with   Hand Problem    Jack Perry is a 70 y.o. male.  Patient presents to the emergency department with a chief complaint of right hand pain.  He has history of carpal tunnel syndrome and had surgery back in February for this.  He reports persistent pain.  He states that he has not been wearing his splint because it hurts.  He has follow-up next week with his hand surgeon.  He states the pain is keeping him from sleeping.  The history is provided by the patient. No language interpreter was used.      Past Medical History:  Diagnosis Date   Erectile dysfunction    Hyperlipidemia    Hypertension    Peyronie's disease    Right knee pain     Patient Active Problem List   Diagnosis Date Noted   Cholecystitis 02/08/2019    Past Surgical History:  Procedure Laterality Date   APPENDECTOMY  1970's   CARPAL TUNNEL RELEASE Right 12/12/2020   CHOLECYSTECTOMY N/A 02/09/2019   Procedure: LAPAROSCOPIC CHOLECYSTECTOMY WITH INTRAOPERATIVE CHOLANGIOGRAM;  Surgeon: Armandina Gemma, MD;  Location: [facility];  Service: General;  Laterality: N/A;       Family History  Problem Relation Age of Onset   Lung cancer Father    Healthy Sister    Heart disease Brother     Social History   Tobacco Use   Smoking status: Former   Smokeless tobacco: Never   Tobacco comments:    Stopped smoking in 1973  Substance Use Topics   Alcohol use: Not Currently    Comment: Stopped drinking over 14 years   Drug use: Never    Home Medications Prior to Admission medications   Medication Sig Start Date End Date Taking? Authorizing Provider  amLODipine-olmesartan (AZOR) 5-20 MG tablet Take 1 tablet by mouth daily.    [provider]  gabapentin (NEURONTIN) 300 MG capsule Take by mouth. 04/09/21   [provider]   rosuvastatin (CRESTOR) 10 MG tablet Take 10 mg by mouth daily.    [provider]  sildenafil (VIAGRA) 100 MG tablet Take 100 mg by mouth daily as needed.    [provider]    Allergies    Patient has no known allergies.  Review of Systems   Review of Systems  All other systems reviewed and are negative.  Physical Exam Updated Vital Signs BP (!) 186/116 (BP Location: Left Arm)   Pulse 84   Temp 98.1 F (36.7 C) (Oral)   Resp 18   Ht 6\' 3"  (1.905 m)   Wt 113.4 kg   SpO2 97%   BMI 31.25 kg/m   Physical Exam Vitals and nursing note reviewed.  Constitutional:      General: He is not in acute distress.    Appearance: He is well-developed. He is not ill-appearing.  HENT:     Head: Normocephalic and atraumatic.  Eyes:     Conjunctiva/sclera: Conjunctivae normal.  Cardiovascular:     Rate and Rhythm: Normal rate.     Pulses: Normal pulses.     Comments: Intact radial pulses Brisk capillary refill Pulmonary:     Effort: Pulmonary effort is normal. No respiratory distress.  Abdominal:     General: There is no distension.  Musculoskeletal:     Cervical back: Neck supple.  Comments: Moves all extremities  Skin:    General: Skin is warm and dry.  Neurological:     Mental Status: He is alert and oriented to person, place, and time.  Psychiatric:        Mood and Affect: Mood normal.        Behavior: Behavior normal.    ED Results / Procedures / Treatments   Labs (all labs ordered are listed, but only abnormal results are displayed) Labs Reviewed - No data to display  EKG None  Radiology No results found.  Procedures Procedures   Medications Ordered in ED Medications - No data to display  ED Course  I have reviewed the triage vital signs and the nursing notes.  Pertinent labs & imaging results that were available during my care of the patient were reviewed by me and considered in my medical decision making (see chart for details).     MDM Rules/Calculators/A&P                          Patient here with chronic right hand pain.  Has history of carpal tunnel syndrome.  Has not been wearing his splint because it hurts.  He has no evidence of infection on exam, there is no erythema.  He is noted to be hypertensive, but otherwise has normal vitals.  I have advised him to apply ice, use the splint, and follow-up with his hand surgeon. Final Clinical Impression(s) / ED Diagnoses Final diagnoses:  Pain of right hand    Rx / DC Orders ED Discharge Orders     None        Montine Circle, PA-C 05/02/21 0338    Shanon Rosser, MD 05/02/21 [phone]

## 2021-05-07 DIAGNOSIS — M65331 Trigger finger, right middle finger: Secondary | ICD-10-CM | POA: Diagnosis not present

## 2021-05-07 DIAGNOSIS — G5601 Carpal tunnel syndrome, right upper limb: Secondary | ICD-10-CM | POA: Diagnosis not present

## 2021-05-14 DIAGNOSIS — G5601 Carpal tunnel syndrome, right upper limb: Secondary | ICD-10-CM | POA: Diagnosis not present

## 2021-05-14 DIAGNOSIS — M6289 Other specified disorders of muscle: Secondary | ICD-10-CM | POA: Diagnosis not present

## 2021-05-14 DIAGNOSIS — Z4889 Encounter for other specified surgical aftercare: Secondary | ICD-10-CM | POA: Diagnosis not present

## 2021-05-18 DIAGNOSIS — Z4889 Encounter for other specified surgical aftercare: Secondary | ICD-10-CM | POA: Diagnosis not present

## 2021-05-18 DIAGNOSIS — G5601 Carpal tunnel syndrome, right upper limb: Secondary | ICD-10-CM | POA: Diagnosis not present

## 2021-05-18 DIAGNOSIS — M6289 Other specified disorders of muscle: Secondary | ICD-10-CM | POA: Diagnosis not present

## 2021-05-22 DIAGNOSIS — Z4889 Encounter for other specified surgical aftercare: Secondary | ICD-10-CM | POA: Diagnosis not present

## 2021-05-22 DIAGNOSIS — G5601 Carpal tunnel syndrome, right upper limb: Secondary | ICD-10-CM | POA: Diagnosis not present

## 2021-05-22 DIAGNOSIS — M6289 Other specified disorders of muscle: Secondary | ICD-10-CM | POA: Diagnosis not present

## 2021-05-25 DIAGNOSIS — Z4889 Encounter for other specified surgical aftercare: Secondary | ICD-10-CM | POA: Diagnosis not present

## 2021-05-25 DIAGNOSIS — G5601 Carpal tunnel syndrome, right upper limb: Secondary | ICD-10-CM | POA: Diagnosis not present

## 2021-05-25 DIAGNOSIS — M6289 Other specified disorders of muscle: Secondary | ICD-10-CM | POA: Diagnosis not present

## 2021-05-26 DIAGNOSIS — Z4889 Encounter for other specified surgical aftercare: Secondary | ICD-10-CM | POA: Diagnosis not present

## 2021-05-26 DIAGNOSIS — G5601 Carpal tunnel syndrome, right upper limb: Secondary | ICD-10-CM | POA: Diagnosis not present

## 2021-05-26 DIAGNOSIS — M6289 Other specified disorders of muscle: Secondary | ICD-10-CM | POA: Diagnosis not present

## 2021-05-27 DIAGNOSIS — Z4889 Encounter for other specified surgical aftercare: Secondary | ICD-10-CM | POA: Diagnosis not present

## 2021-05-27 DIAGNOSIS — G5601 Carpal tunnel syndrome, right upper limb: Secondary | ICD-10-CM | POA: Diagnosis not present

## 2021-05-27 DIAGNOSIS — M6289 Other specified disorders of muscle: Secondary | ICD-10-CM | POA: Diagnosis not present

## 2021-06-02 DIAGNOSIS — Z4889 Encounter for other specified surgical aftercare: Secondary | ICD-10-CM | POA: Diagnosis not present

## 2021-06-02 DIAGNOSIS — G5601 Carpal tunnel syndrome, right upper limb: Secondary | ICD-10-CM | POA: Diagnosis not present

## 2021-06-02 DIAGNOSIS — M6289 Other specified disorders of muscle: Secondary | ICD-10-CM | POA: Diagnosis not present

## 2021-06-08 DIAGNOSIS — M6289 Other specified disorders of muscle: Secondary | ICD-10-CM | POA: Diagnosis not present

## 2021-06-08 DIAGNOSIS — Z4889 Encounter for other specified surgical aftercare: Secondary | ICD-10-CM | POA: Diagnosis not present

## 2021-06-08 DIAGNOSIS — G5601 Carpal tunnel syndrome, right upper limb: Secondary | ICD-10-CM | POA: Diagnosis not present

## 2021-06-16 DIAGNOSIS — Z4889 Encounter for other specified surgical aftercare: Secondary | ICD-10-CM | POA: Diagnosis not present

## 2021-06-16 DIAGNOSIS — M6289 Other specified disorders of muscle: Secondary | ICD-10-CM | POA: Diagnosis not present

## 2021-06-16 DIAGNOSIS — G5602 Carpal tunnel syndrome, left upper limb: Secondary | ICD-10-CM | POA: Diagnosis not present

## 2021-06-16 DIAGNOSIS — G5601 Carpal tunnel syndrome, right upper limb: Secondary | ICD-10-CM | POA: Diagnosis not present

## 2021-06-18 DIAGNOSIS — G5601 Carpal tunnel syndrome, right upper limb: Secondary | ICD-10-CM | POA: Diagnosis not present

## 2021-06-18 DIAGNOSIS — Z4889 Encounter for other specified surgical aftercare: Secondary | ICD-10-CM | POA: Diagnosis not present

## 2021-06-18 DIAGNOSIS — M6289 Other specified disorders of muscle: Secondary | ICD-10-CM | POA: Diagnosis not present

## 2021-06-23 DIAGNOSIS — G5601 Carpal tunnel syndrome, right upper limb: Secondary | ICD-10-CM | POA: Diagnosis not present

## 2021-06-23 DIAGNOSIS — M6289 Other specified disorders of muscle: Secondary | ICD-10-CM | POA: Diagnosis not present

## 2021-06-23 DIAGNOSIS — Z4889 Encounter for other specified surgical aftercare: Secondary | ICD-10-CM | POA: Diagnosis not present

## 2021-06-25 DIAGNOSIS — Z4889 Encounter for other specified surgical aftercare: Secondary | ICD-10-CM | POA: Diagnosis not present

## 2021-06-25 DIAGNOSIS — G5601 Carpal tunnel syndrome, right upper limb: Secondary | ICD-10-CM | POA: Diagnosis not present

## 2021-06-25 DIAGNOSIS — M6289 Other specified disorders of muscle: Secondary | ICD-10-CM | POA: Diagnosis not present

## 2021-06-30 DIAGNOSIS — Z4889 Encounter for other specified surgical aftercare: Secondary | ICD-10-CM | POA: Diagnosis not present

## 2021-06-30 DIAGNOSIS — M6289 Other specified disorders of muscle: Secondary | ICD-10-CM | POA: Diagnosis not present

## 2021-06-30 DIAGNOSIS — G5601 Carpal tunnel syndrome, right upper limb: Secondary | ICD-10-CM | POA: Diagnosis not present

## 2021-07-02 DIAGNOSIS — Z4889 Encounter for other specified surgical aftercare: Secondary | ICD-10-CM | POA: Diagnosis not present

## 2021-07-02 DIAGNOSIS — M6289 Other specified disorders of muscle: Secondary | ICD-10-CM | POA: Diagnosis not present

## 2021-07-02 DIAGNOSIS — G5601 Carpal tunnel syndrome, right upper limb: Secondary | ICD-10-CM | POA: Diagnosis not present

## 2021-07-07 DIAGNOSIS — G5601 Carpal tunnel syndrome, right upper limb: Secondary | ICD-10-CM | POA: Diagnosis not present

## 2021-07-07 DIAGNOSIS — M6289 Other specified disorders of muscle: Secondary | ICD-10-CM | POA: Diagnosis not present

## 2021-07-07 DIAGNOSIS — Z4889 Encounter for other specified surgical aftercare: Secondary | ICD-10-CM | POA: Diagnosis not present

## 2021-07-09 DIAGNOSIS — G5601 Carpal tunnel syndrome, right upper limb: Secondary | ICD-10-CM | POA: Diagnosis not present

## 2021-07-09 DIAGNOSIS — Z4889 Encounter for other specified surgical aftercare: Secondary | ICD-10-CM | POA: Diagnosis not present

## 2021-07-09 DIAGNOSIS — M6289 Other specified disorders of muscle: Secondary | ICD-10-CM | POA: Diagnosis not present

## 2021-07-14 DIAGNOSIS — Z4889 Encounter for other specified surgical aftercare: Secondary | ICD-10-CM | POA: Diagnosis not present

## 2021-07-14 DIAGNOSIS — M6289 Other specified disorders of muscle: Secondary | ICD-10-CM | POA: Diagnosis not present

## 2021-07-14 DIAGNOSIS — G5601 Carpal tunnel syndrome, right upper limb: Secondary | ICD-10-CM | POA: Diagnosis not present

## 2021-07-16 DIAGNOSIS — Z4889 Encounter for other specified surgical aftercare: Secondary | ICD-10-CM | POA: Diagnosis not present

## 2021-07-16 DIAGNOSIS — M6289 Other specified disorders of muscle: Secondary | ICD-10-CM | POA: Diagnosis not present

## 2021-07-16 DIAGNOSIS — G5601 Carpal tunnel syndrome, right upper limb: Secondary | ICD-10-CM | POA: Diagnosis not present

## 2021-07-22 DIAGNOSIS — G5601 Carpal tunnel syndrome, right upper limb: Secondary | ICD-10-CM | POA: Diagnosis not present

## 2021-07-22 DIAGNOSIS — Z4889 Encounter for other specified surgical aftercare: Secondary | ICD-10-CM | POA: Diagnosis not present

## 2021-07-22 DIAGNOSIS — M6289 Other specified disorders of muscle: Secondary | ICD-10-CM | POA: Diagnosis not present

## 2021-07-24 DIAGNOSIS — Z4889 Encounter for other specified surgical aftercare: Secondary | ICD-10-CM | POA: Diagnosis not present

## 2021-07-24 DIAGNOSIS — M6289 Other specified disorders of muscle: Secondary | ICD-10-CM | POA: Diagnosis not present

## 2021-07-24 DIAGNOSIS — G5601 Carpal tunnel syndrome, right upper limb: Secondary | ICD-10-CM | POA: Diagnosis not present

## 2021-07-31 DIAGNOSIS — N5201 Erectile dysfunction due to arterial insufficiency: Secondary | ICD-10-CM | POA: Diagnosis not present

## 2021-08-04 DIAGNOSIS — Z125 Encounter for screening for malignant neoplasm of prostate: Secondary | ICD-10-CM | POA: Diagnosis not present

## 2021-08-04 DIAGNOSIS — E785 Hyperlipidemia, unspecified: Secondary | ICD-10-CM | POA: Diagnosis not present

## 2021-08-04 DIAGNOSIS — I10 Essential (primary) hypertension: Secondary | ICD-10-CM | POA: Diagnosis not present

## 2021-08-11 DIAGNOSIS — I10 Essential (primary) hypertension: Secondary | ICD-10-CM | POA: Diagnosis not present

## 2021-08-11 DIAGNOSIS — Z0000 Encounter for general adult medical examination without abnormal findings: Secondary | ICD-10-CM | POA: Diagnosis not present

## 2021-08-11 DIAGNOSIS — C9110 Chronic lymphocytic leukemia of B-cell type not having achieved remission: Secondary | ICD-10-CM | POA: Diagnosis not present

## 2021-08-11 DIAGNOSIS — M25561 Pain in right knee: Secondary | ICD-10-CM | POA: Diagnosis not present

## 2021-08-11 DIAGNOSIS — M79641 Pain in right hand: Secondary | ICD-10-CM | POA: Diagnosis not present

## 2021-08-11 DIAGNOSIS — I6523 Occlusion and stenosis of bilateral carotid arteries: Secondary | ICD-10-CM | POA: Diagnosis not present

## 2021-08-11 DIAGNOSIS — N5201 Erectile dysfunction due to arterial insufficiency: Secondary | ICD-10-CM | POA: Diagnosis not present

## 2021-08-11 DIAGNOSIS — E785 Hyperlipidemia, unspecified: Secondary | ICD-10-CM | POA: Diagnosis not present

## 2021-09-02 ENCOUNTER — Other Ambulatory Visit: Payer: Medicare HMO

## 2021-09-02 ENCOUNTER — Ambulatory Visit: Payer: Medicare HMO | Admitting: Hematology and Oncology

## 2021-09-09 ENCOUNTER — Other Ambulatory Visit: Payer: Self-pay | Admitting: Hematology and Oncology

## 2021-09-09 ENCOUNTER — Other Ambulatory Visit: Payer: Self-pay

## 2021-09-09 ENCOUNTER — Inpatient Hospital Stay: Payer: Medicare HMO | Admitting: Hematology and Oncology

## 2021-09-09 ENCOUNTER — Inpatient Hospital Stay: Payer: Medicare HMO | Attending: Hematology and Oncology

## 2021-09-09 VITALS — BP 138/76 | HR 61 | Temp 98.1°F | Resp 17 | Ht 75.0 in | Wt 254.2 lb

## 2021-09-09 DIAGNOSIS — C9110 Chronic lymphocytic leukemia of B-cell type not having achieved remission: Secondary | ICD-10-CM | POA: Diagnosis not present

## 2021-09-09 DIAGNOSIS — D696 Thrombocytopenia, unspecified: Secondary | ICD-10-CM | POA: Insufficient documentation

## 2021-09-09 DIAGNOSIS — Z801 Family history of malignant neoplasm of trachea, bronchus and lung: Secondary | ICD-10-CM | POA: Insufficient documentation

## 2021-09-09 DIAGNOSIS — Z87891 Personal history of nicotine dependence: Secondary | ICD-10-CM | POA: Diagnosis not present

## 2021-09-09 DIAGNOSIS — D72820 Lymphocytosis (symptomatic): Secondary | ICD-10-CM | POA: Diagnosis not present

## 2021-09-09 LAB — CBC WITH DIFFERENTIAL (CANCER CENTER ONLY)
Abs Immature Granulocytes: 0.02 10*3/uL (ref 0.00–0.07)
Basophils Absolute: 0.1 10*3/uL (ref 0.0–0.1)
Basophils Relative: 0 %
Eosinophils Absolute: 0.1 10*3/uL (ref 0.0–0.5)
Eosinophils Relative: 0 %
HCT: 44.3 % (ref 39.0–52.0)
Hemoglobin: 14.4 g/dL (ref 13.0–17.0)
Immature Granulocytes: 0 %
Lymphocytes Relative: 82 %
Lymphs Abs: 12 10*3/uL — ABNORMAL HIGH (ref 0.7–4.0)
MCH: 31.4 pg (ref 26.0–34.0)
MCHC: 32.5 g/dL (ref 30.0–36.0)
MCV: 96.7 fL (ref 80.0–100.0)
Monocytes Absolute: 0.5 10*3/uL (ref 0.1–1.0)
Monocytes Relative: 3 %
Neutro Abs: 2.2 10*3/uL (ref 1.7–7.7)
Neutrophils Relative %: 15 %
Platelet Count: 130 10*3/uL — ABNORMAL LOW (ref 150–400)
RBC: 4.58 MIL/uL (ref 4.22–5.81)
RDW: 13.1 % (ref 11.5–15.5)
Smear Review: NORMAL
WBC Count: 14.8 10*3/uL — ABNORMAL HIGH (ref 4.0–10.5)
nRBC: 0 % (ref 0.0–0.2)

## 2021-09-09 LAB — CMP (CANCER CENTER ONLY)
ALT: 18 U/L (ref 0–44)
AST: 25 U/L (ref 15–41)
Albumin: 3.9 g/dL (ref 3.5–5.0)
Alkaline Phosphatase: 66 U/L (ref 38–126)
Anion gap: 5 (ref 5–15)
BUN: 19 mg/dL (ref 8–23)
CO2: 28 mmol/L (ref 22–32)
Calcium: 9 mg/dL (ref 8.9–10.3)
Chloride: 107 mmol/L (ref 98–111)
Creatinine: 1.03 mg/dL (ref 0.61–1.24)
GFR, Estimated: >60 mL/min (ref >60)
Glucose, Bld: 83 mg/dL (ref 70–99)
Potassium: 3.9 mmol/L (ref 3.5–5.1)
Sodium: 140 mmol/L (ref 135–145)
Total Bilirubin: 0.6 mg/dL (ref 0.3–1.2)
Total Protein: 6.5 g/dL (ref 6.5–8.1)

## 2021-09-09 NOTE — Progress Notes (Signed)
[facility] Telephone:([phone]   Fax:([phone]  PROGRESS NOTE  Patient Care Team: Leanna Battles, MD as PCP - General (Internal Medicine)  Hematological/Oncological History  #Chronic Lymphocytic Leukemia, in observation 1) 02/07/2021: WBC 10.7, Hgb 16.0, MCV 95.3, Plt 172. ALC 7200 2) 12/04/2020: Lab work from PCP revealed WBC 14.97 K/uL, lymphocytes 11.9 K/uL, lymphocyte % 79.3 3) 12/24/2020: Establish care with Dr. Lorenso Courier -Flow Cytometry: Monoclonal B-cell population consistent with chronic lymphocytic leukemia. FISH: del(13q) identified. 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   HISTORY OF PRESENTING ILLNESS:  Jack Perry 70 y.o. male with medical history significant for CLL who presents for a follow up visit. He was last seen on 04/16/2021. In the interim since his last visit his mutational panel showed an IgHV mutation and was negative for ZAP-70.    On exam today Jack Perry reports he has been well in the interim since his last visit.  He reports his energy level is good and his appetite has been strong.  He denies any bumps or lumps or other overt signs of lymphadenopathy.  He has had no other major changes in his health such as new medications or visits to the hospital emergency department.  He also denies any bleeding or bruising.  Patient denies any fevers, chills, night sweats, shortness of breath, chest pain, cough or other skin changes. He has no other complaints. The remaining 10 point ROS is below.   MEDICAL HISTORY:  Past Medical History:  Diagnosis Date   Erectile dysfunction    Hyperlipidemia    Hypertension    Peyronie's disease    Right knee pain     SURGICAL HISTORY: Past Surgical History:  Procedure Laterality Date   APPENDECTOMY  1970's   CARPAL TUNNEL RELEASE Right 12/12/2020   CHOLECYSTECTOMY N/A 02/09/2019   Procedure: LAPAROSCOPIC CHOLECYSTECTOMY WITH INTRAOPERATIVE CHOLANGIOGRAM;  Surgeon: Armandina Gemma, MD;  Location: [facility];   Service: General;  Laterality: N/A;    SOCIAL HISTORY: Social History   Socioeconomic History   Marital status: Single    Spouse name: Not on file   Number of children: Not on file   Years of education: Not on file   Highest education level: Not on file  Occupational History   Occupation: Self employed    Comment: Heating and air  Tobacco Use   Smoking status: Former   Smokeless tobacco: Never   Tobacco comments:    Stopped smoking in 1973  Substance and Sexual Activity   Alcohol use: Not Currently    Comment: Stopped drinking over 14 years   Drug use: Never   Sexual activity: Not on file  Other Topics Concern   Not on file  Social History Narrative   Not on file   Social Determinants of Health   Financial Resource Strain: Not on file  Food Insecurity: Not on file  Transportation Needs: Not on file  Physical Activity: Not on file  Stress: Not on file  Social Connections: Not on file  Intimate Partner Violence: Not on file    FAMILY HISTORY: Family History  Problem Relation Age of Onset   Lung cancer Father    Healthy Sister    Heart disease Brother     ALLERGIES:  has No Known Allergies.  MEDICATIONS:  Current Outpatient Medications  Medication Sig Dispense Refill   amLODipine-olmesartan (AZOR) 5-20 MG tablet Take 1 tablet by mouth daily.     gabapentin (NEURONTIN) 300 MG capsule Take by mouth.     rosuvastatin (  CRESTOR) 10 MG tablet Take 10 mg by mouth daily.     sildenafil (VIAGRA) 100 MG tablet Take 100 mg by mouth daily as needed.     No current facility-administered medications for this visit.    REVIEW OF SYSTEMS:   Constitutional: ( - ) fevers, ( - )  chills , ( - ) night sweats Eyes: ( - ) blurriness of vision, ( - ) double vision, ( - ) watery eyes Ears, nose, mouth, throat, and face: ( - ) mucositis, ( - ) sore throat Respiratory: ( - ) cough, ( - ) dyspnea, ( - ) wheezes Cardiovascular: ( - ) palpitation, ( - ) chest discomfort, ( - )  lower extremity swelling Gastrointestinal:  ( - ) nausea, ( - ) heartburn, ( - ) change in bowel habits Skin: ( - ) abnormal skin rashes Lymphatics: ( - ) new lymphadenopathy, ( - ) easy bruising Neurological: ( - ) numbness, ( - ) tingling, ( - ) new weaknesses Behavioral/Psych: ( - ) mood change, ( - ) new changes  All other systems were reviewed with the patient and are negative.  PHYSICAL EXAMINATION: ECOG PERFORMANCE STATUS: 0 - Asymptomatic  Vitals:   09/09/21 1034  BP: 138/76  Pulse: 61  Resp: 17  Temp: 98.1 F (36.7 C)  SpO2: 100%    Filed Weights   09/09/21 1034  Weight: 254 lb 3.2 oz (115.3 kg)     GENERAL: well appearing african american gentleman in NAD  SKIN: skin color, texture, turgor are normal, no rashes or significant lesions EYES: conjunctiva are pink and non-injected, sclera clear NECK: supple, non-tender LYMPH:  no palpable lymphadenopathy in the cervical, axillary or supraclavicular lymph nodes.  LUNGS: clear to auscultation and percussion with normal breathing effort HEART: regular rate & rhythm and no murmurs and no lower extremity edema PSYCH: alert & oriented x 3, fluent speech NEURO: no focal motor/sensory deficits  LABORATORY DATA:  I have reviewed the data as listed CBC Latest Ref Rng & Units 09/09/2021 04/16/2021 01/14/2021  WBC 4.0 - 10.5 K/uL 14.8(H) 13.6(H) 13.1(H)  Hemoglobin 13.0 - 17.0 g/dL 14.4 15.0 15.8  Hematocrit 39.0 - 52.0 % 44.3 45.2 48.7  Platelets 150 - 400 K/uL 130(L) 116(L) 119(L)    CMP Latest Ref Rng & Units 09/09/2021 04/16/2021 01/14/2021  Glucose 70 - 99 mg/dL 83 97 148(H)  BUN 8 - 23 mg/dL 19 22 23   Creatinine 0.61 - 1.24 mg/dL 1.03 1.06 1.05  Sodium 135 - 145 mmol/L 140 139 140  Potassium 3.5 - 5.1 mmol/L 3.9 4.0 3.6  Chloride 98 - 111 mmol/L 107 104 104  CO2 22 - 32 mmol/L 28 28 27   Calcium 8.9 - 10.3 mg/dL 9.0 9.3 9.0  Total Protein 6.5 - 8.1 g/dL 6.5 6.6 6.9  Total Bilirubin 0.3 - 1.2 mg/dL 0.6 0.5 0.8   Alkaline Phos 38 - 126 U/L 66 67 62  AST 15 - 41 U/L 25 22 59(H)  ALT 0 - 44 U/L 18 13 21     RADIOGRAPHIC STUDIES: No images were reviewed during this visit.  No results found.  ASSESSMENT & PLAN Jack Perry is a 70 y.o. male presents for a follow up after recent diagnosis of CLL. Reviewed FISH results that indicate a del(13q) which is associated with a favorable prognostic outcome. Today's labs shows show stable WBC and mild thrombocytopenia. 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   #Chronic Lymphocytic Leukemia: #Thrombocytopenia #Lymphocytosis --Flow Cytometry: Monoclonal B-cell population consistent with chronic lymphocytic leukemia.  --FISH: del(13q) identified, Positive for IgHV mutation and negative ZAP 70  --Labs from today shows stable WBC 14.8 and Lymphs Abs 10.5 Platelets 130. Splenic US showed normal spleen size.  --Plan to proceed with close observation.  --RTC in 6 months with labs  No orders of the defined types were placed in this encounter.  All questions were answered. The patient knows to call the clinic with any problems, questions or concerns.  A total of more than 30 minutes were spent on this encounter and over half of that time was spent on counseling and coordination of care as outlined above.   Ledell Peoples, MD Department of Hematology/Oncology [place] at [facility] Phone: [phone] Pager: [phone] Email: [email or web address]  09/09/2021 12:34 PM

## 2021-09-22 DIAGNOSIS — G5602 Carpal tunnel syndrome, left upper limb: Secondary | ICD-10-CM | POA: Diagnosis not present

## 2021-09-22 DIAGNOSIS — M65331 Trigger finger, right middle finger: Secondary | ICD-10-CM | POA: Diagnosis not present

## 2021-09-22 DIAGNOSIS — G5601 Carpal tunnel syndrome, right upper limb: Secondary | ICD-10-CM | POA: Diagnosis not present

## 2021-11-12 DIAGNOSIS — I10 Essential (primary) hypertension: Secondary | ICD-10-CM | POA: Diagnosis not present

## 2021-11-12 DIAGNOSIS — C9110 Chronic lymphocytic leukemia of B-cell type not having achieved remission: Secondary | ICD-10-CM | POA: Diagnosis not present

## 2021-11-12 DIAGNOSIS — M25561 Pain in right knee: Secondary | ICD-10-CM | POA: Diagnosis not present

## 2021-11-12 DIAGNOSIS — E785 Hyperlipidemia, unspecified: Secondary | ICD-10-CM | POA: Diagnosis not present

## 2021-11-14 IMAGING — US US ABDOMEN COMPLETE
1 series · 14 of 25 positions shown · non-contrast
Comparison: Ultrasound February 08, 2019

CLINICAL DATA: Evaluate spleen liver size.

EXAM:
ABDOMEN ULTRASOUND COMPLETE

[Series 1: us abdomen complete · 14 of 69 slices shown]
[im 1/69]
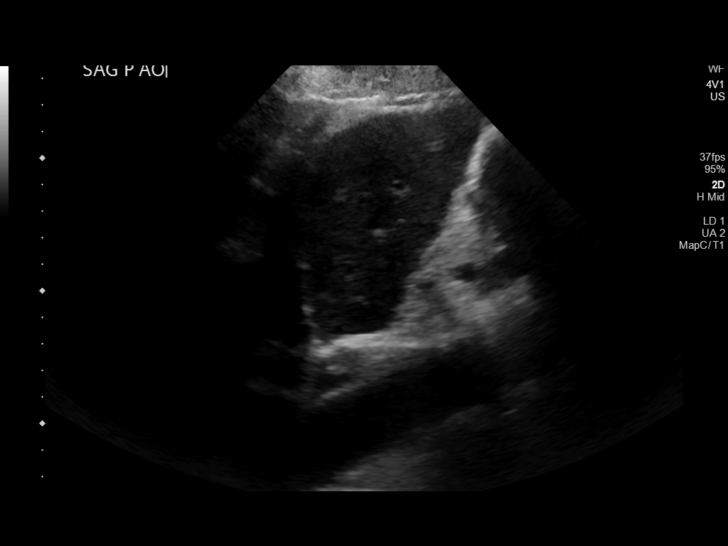
[im 6/69]
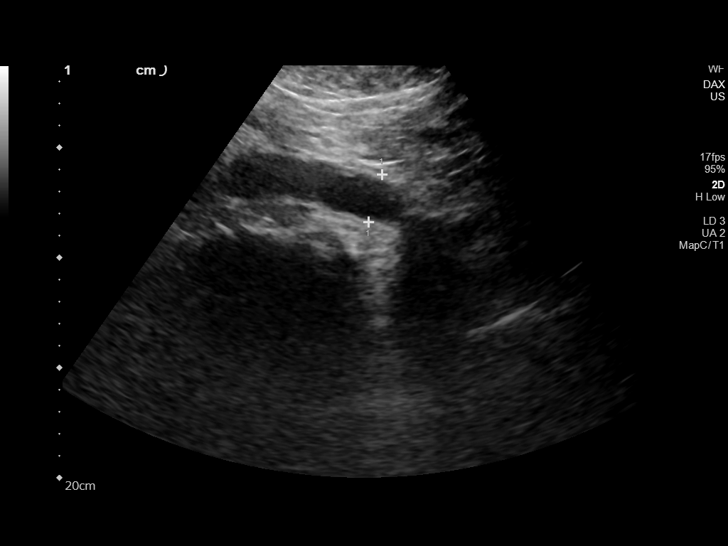
[im 12/69]
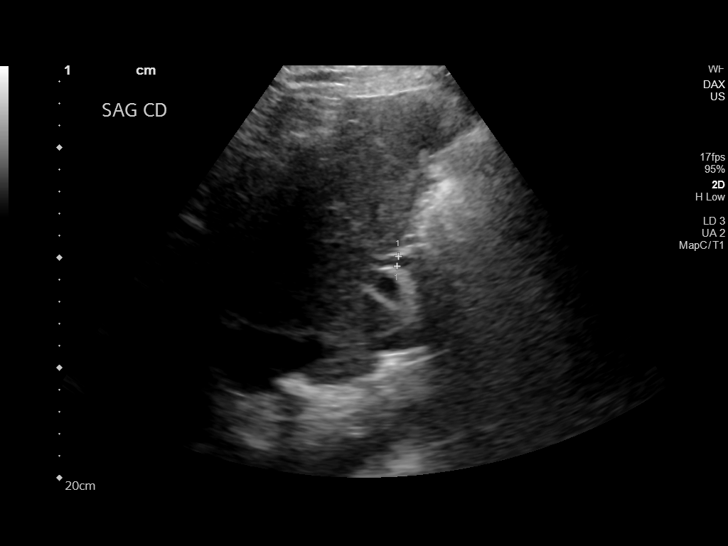
[im 18/69]
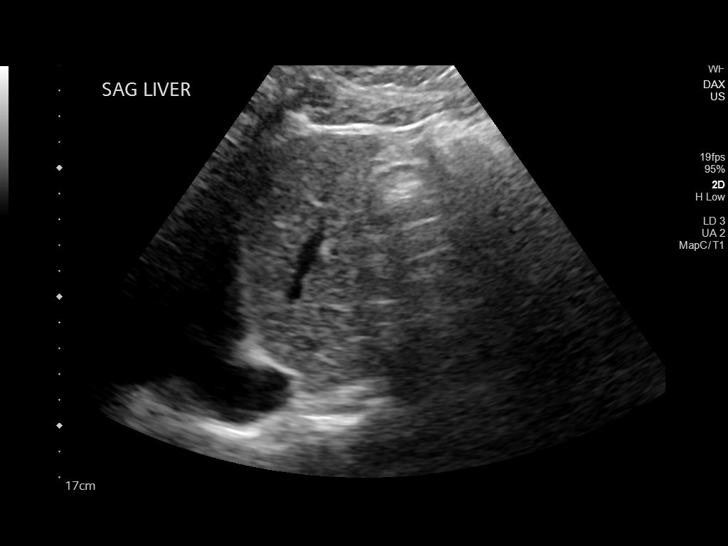
[im 23/69]
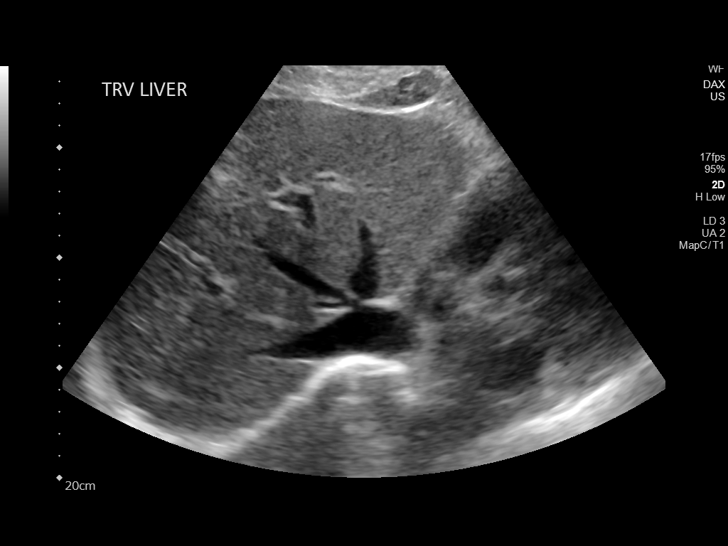
[im 26/69]
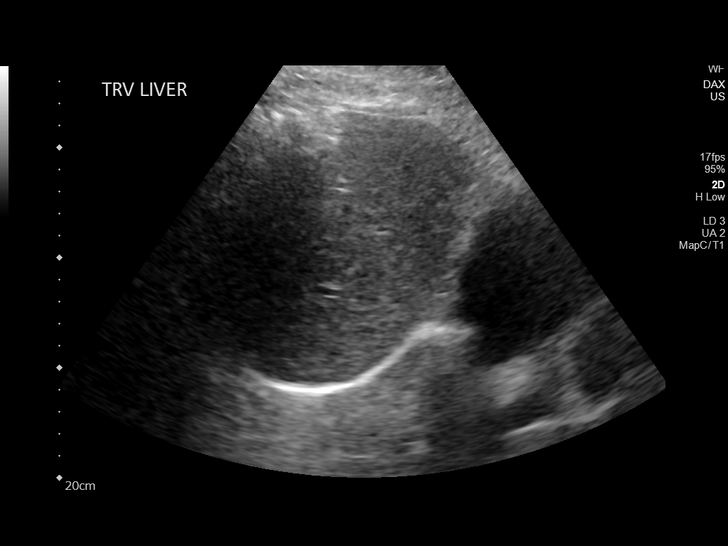
[im 32/69]
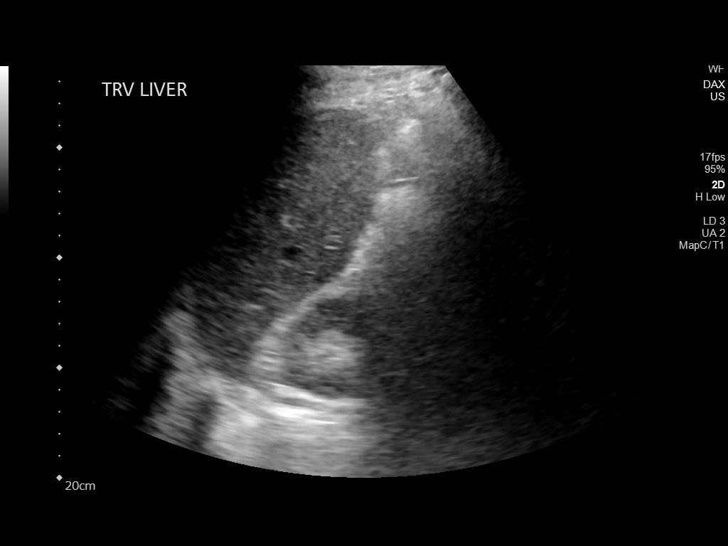
[im 37/69]
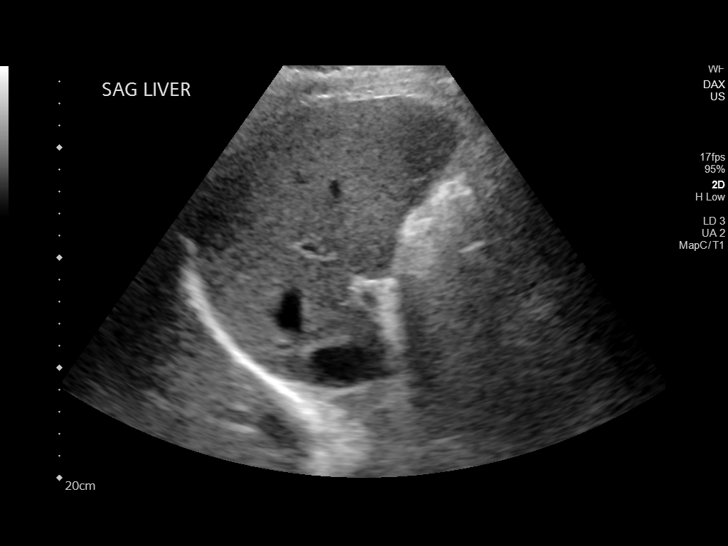
[im 43/69]
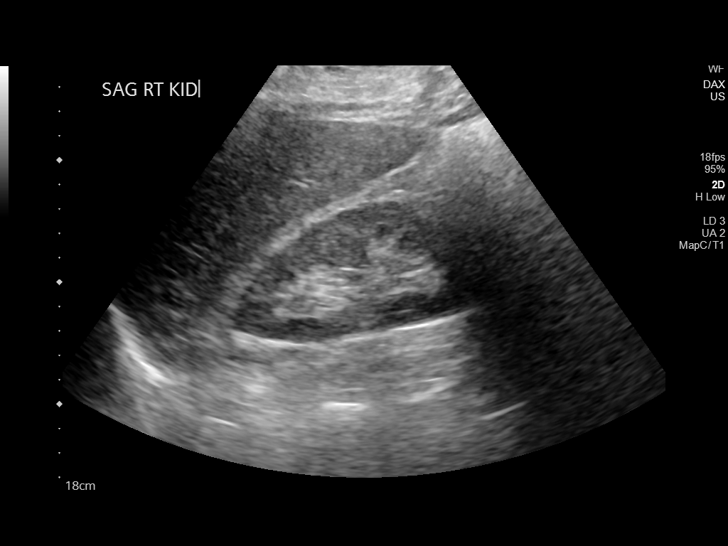
[im 46/69]
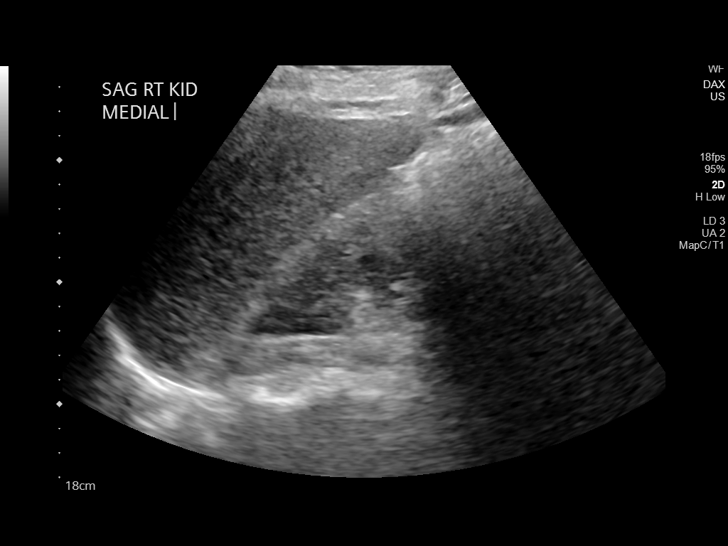
[im 52/69]
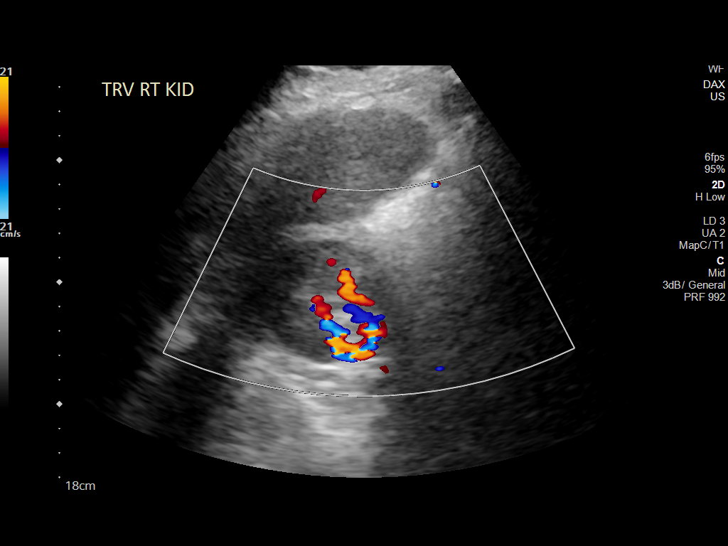
[im 57/69]
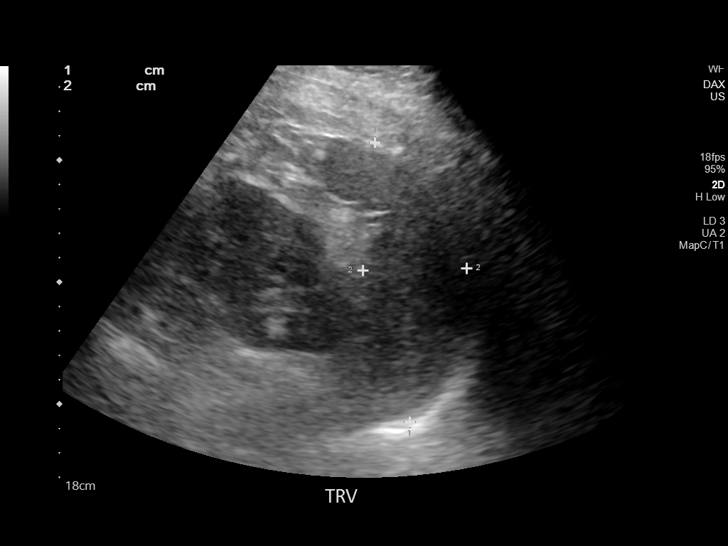
[im 63/69]
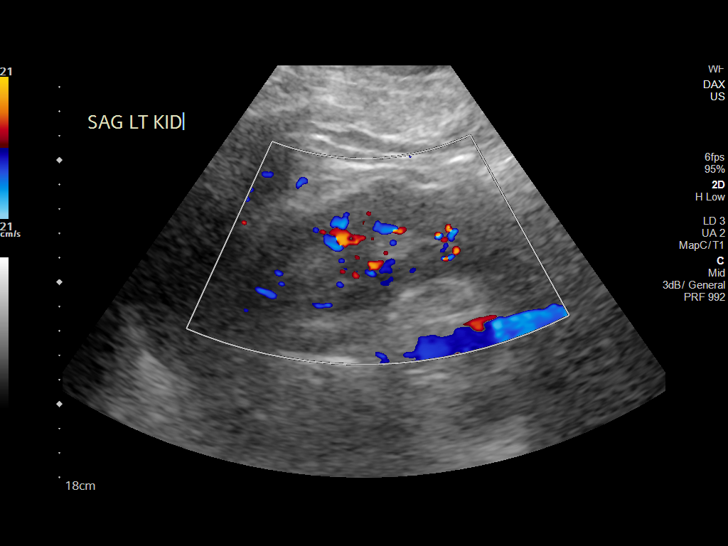
[im 69/69]
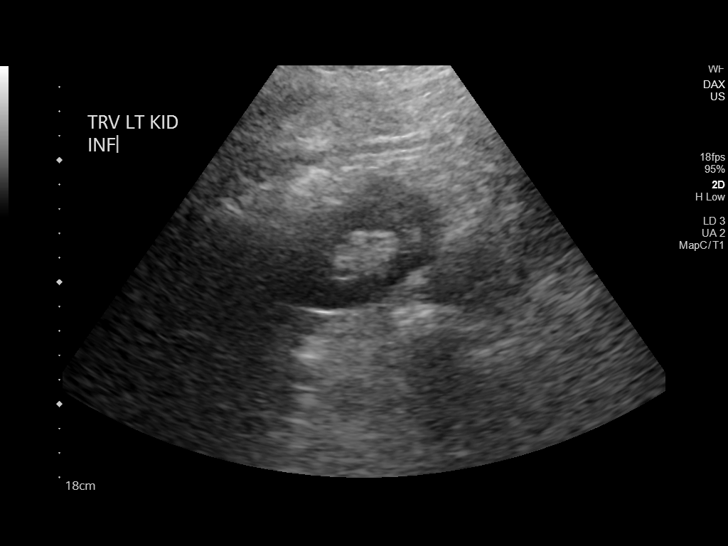

[14 of 25 positions shown; findings below may reference images not displayed]

FINDINGS: Gallbladder: Surgically absent

Common bile duct: Diameter: 5 mm

Liver: No focal lesion identified. Diffusely increased parenchymal
echogenicity. Portal vein is patent on color Doppler imaging with
normal direction of blood flow towards the liver.

IVC: No abnormality visualized.

Pancreas: Visualized portion unremarkable.

Spleen: Size and appearance within normal limits.

Right Kidney: Length: 10.8 cm. Echogenicity within normal limits. No
mass or hydronephrosis visualized.

Left Kidney: Length: 12.4 cm. Echogenicity within normal limits. No
mass or hydronephrosis visualized.

Abdominal aorta: No aneurysm visualized.

Other findings: None.
IMPRESSION: 1. The echogenicity of the liver is increased. This is a nonspecific
finding but is most commonly seen with fatty infiltration of the
liver. There are no obvious focal liver lesions. No
hepatosplenomegaly.

## 2022-02-11 DIAGNOSIS — I10 Essential (primary) hypertension: Secondary | ICD-10-CM | POA: Diagnosis not present

## 2022-02-11 DIAGNOSIS — M25561 Pain in right knee: Secondary | ICD-10-CM | POA: Diagnosis not present

## 2022-02-11 DIAGNOSIS — C9110 Chronic lymphocytic leukemia of B-cell type not having achieved remission: Secondary | ICD-10-CM | POA: Diagnosis not present

## 2022-03-17 DIAGNOSIS — N5201 Erectile dysfunction due to arterial insufficiency: Secondary | ICD-10-CM | POA: Diagnosis not present

## 2022-08-16 DIAGNOSIS — R948 Abnormal results of function studies of other organs and systems: Secondary | ICD-10-CM | POA: Diagnosis not present

## 2022-08-16 DIAGNOSIS — N5201 Erectile dysfunction due to arterial insufficiency: Secondary | ICD-10-CM | POA: Diagnosis not present

## 2022-08-16 DIAGNOSIS — R3121 Asymptomatic microscopic hematuria: Secondary | ICD-10-CM | POA: Diagnosis not present

## 2022-08-31 DIAGNOSIS — Z125 Encounter for screening for malignant neoplasm of prostate: Secondary | ICD-10-CM | POA: Diagnosis not present

## 2022-08-31 DIAGNOSIS — E785 Hyperlipidemia, unspecified: Secondary | ICD-10-CM | POA: Diagnosis not present

## 2022-08-31 DIAGNOSIS — R7989 Other specified abnormal findings of blood chemistry: Secondary | ICD-10-CM | POA: Diagnosis not present

## 2022-08-31 DIAGNOSIS — I10 Essential (primary) hypertension: Secondary | ICD-10-CM | POA: Diagnosis not present

## 2022-09-07 DIAGNOSIS — Z0000 Encounter for general adult medical examination without abnormal findings: Secondary | ICD-10-CM | POA: Diagnosis not present

## 2022-09-07 DIAGNOSIS — M5030 Other cervical disc degeneration, unspecified cervical region: Secondary | ICD-10-CM | POA: Diagnosis not present

## 2022-09-07 DIAGNOSIS — C9110 Chronic lymphocytic leukemia of B-cell type not having achieved remission: Secondary | ICD-10-CM | POA: Diagnosis not present

## 2022-09-07 DIAGNOSIS — E785 Hyperlipidemia, unspecified: Secondary | ICD-10-CM | POA: Diagnosis not present

## 2022-09-07 DIAGNOSIS — I10 Essential (primary) hypertension: Secondary | ICD-10-CM | POA: Diagnosis not present

## 2022-09-07 DIAGNOSIS — N529 Male erectile dysfunction, unspecified: Secondary | ICD-10-CM | POA: Diagnosis not present

## 2022-09-15 DIAGNOSIS — R3121 Asymptomatic microscopic hematuria: Secondary | ICD-10-CM | POA: Diagnosis not present

## 2022-09-22 DIAGNOSIS — N400 Enlarged prostate without lower urinary tract symptoms: Secondary | ICD-10-CM | POA: Diagnosis not present

## 2022-09-22 DIAGNOSIS — R3121 Asymptomatic microscopic hematuria: Secondary | ICD-10-CM | POA: Diagnosis not present

## 2022-09-22 DIAGNOSIS — N281 Cyst of kidney, acquired: Secondary | ICD-10-CM | POA: Diagnosis not present

## 2022-09-29 DIAGNOSIS — R3121 Asymptomatic microscopic hematuria: Secondary | ICD-10-CM | POA: Diagnosis not present

## 2023-03-08 DIAGNOSIS — I10 Essential (primary) hypertension: Secondary | ICD-10-CM | POA: Diagnosis not present

## 2023-03-08 DIAGNOSIS — E785 Hyperlipidemia, unspecified: Secondary | ICD-10-CM | POA: Diagnosis not present

## 2023-03-08 DIAGNOSIS — C9110 Chronic lymphocytic leukemia of B-cell type not having achieved remission: Secondary | ICD-10-CM | POA: Diagnosis not present

## 2023-03-08 DIAGNOSIS — I6523 Occlusion and stenosis of bilateral carotid arteries: Secondary | ICD-10-CM | POA: Diagnosis not present

## 2023-03-09 ENCOUNTER — Ambulatory Visit (HOSPITAL_COMMUNITY)
Admission: RE | Admit: 2023-03-09 | Discharge: 2023-03-09 | Disposition: A | Discharge status: Home / Self Care | Payer: Medicare HMO | Source: Ambulatory Visit | Attending: Vascular Surgery | Admitting: Vascular Surgery

## 2023-03-09 ENCOUNTER — Other Ambulatory Visit (HOSPITAL_COMMUNITY): Payer: Self-pay | Admitting: Internal Medicine

## 2023-03-09 DIAGNOSIS — I10 Essential (primary) hypertension: Secondary | ICD-10-CM | POA: Insufficient documentation

## 2023-03-09 DIAGNOSIS — R202 Paresthesia of skin: Secondary | ICD-10-CM | POA: Insufficient documentation

## 2023-03-31 DIAGNOSIS — M778 Other enthesopathies, not elsewhere classified: Secondary | ICD-10-CM | POA: Diagnosis not present

## 2023-03-31 DIAGNOSIS — M79602 Pain in left arm: Secondary | ICD-10-CM | POA: Diagnosis not present

## 2023-04-27 DIAGNOSIS — M25512 Pain in left shoulder: Secondary | ICD-10-CM | POA: Diagnosis not present

## 2023-09-19 DIAGNOSIS — Z1212 Encounter for screening for malignant neoplasm of rectum: Secondary | ICD-10-CM | POA: Diagnosis not present

## 2023-09-19 DIAGNOSIS — E785 Hyperlipidemia, unspecified: Secondary | ICD-10-CM | POA: Diagnosis not present

## 2023-09-20 DIAGNOSIS — E785 Hyperlipidemia, unspecified: Secondary | ICD-10-CM | POA: Diagnosis not present

## 2023-09-20 DIAGNOSIS — I10 Essential (primary) hypertension: Secondary | ICD-10-CM | POA: Diagnosis not present

## 2023-09-20 DIAGNOSIS — Z125 Encounter for screening for malignant neoplasm of prostate: Secondary | ICD-10-CM | POA: Diagnosis not present

## 2023-09-26 DIAGNOSIS — N5201 Erectile dysfunction due to arterial insufficiency: Secondary | ICD-10-CM | POA: Diagnosis not present

## 2023-09-26 DIAGNOSIS — R82998 Other abnormal findings in urine: Secondary | ICD-10-CM | POA: Diagnosis not present

## 2023-09-26 DIAGNOSIS — E785 Hyperlipidemia, unspecified: Secondary | ICD-10-CM | POA: Diagnosis not present

## 2023-09-26 DIAGNOSIS — I10 Essential (primary) hypertension: Secondary | ICD-10-CM | POA: Diagnosis not present

## 2023-09-26 DIAGNOSIS — I6523 Occlusion and stenosis of bilateral carotid arteries: Secondary | ICD-10-CM | POA: Diagnosis not present

## 2023-09-26 DIAGNOSIS — C9110 Chronic lymphocytic leukemia of B-cell type not having achieved remission: Secondary | ICD-10-CM | POA: Diagnosis not present

## 2023-09-26 DIAGNOSIS — G5600 Carpal tunnel syndrome, unspecified upper limb: Secondary | ICD-10-CM | POA: Diagnosis not present

## 2023-09-26 DIAGNOSIS — Z0000 Encounter for general adult medical examination without abnormal findings: Secondary | ICD-10-CM | POA: Diagnosis not present

## 2023-09-29 DIAGNOSIS — N5201 Erectile dysfunction due to arterial insufficiency: Secondary | ICD-10-CM | POA: Diagnosis not present

## 2023-09-29 DIAGNOSIS — N400 Enlarged prostate without lower urinary tract symptoms: Secondary | ICD-10-CM | POA: Diagnosis not present

## 2023-10-25 DIAGNOSIS — G5602 Carpal tunnel syndrome, left upper limb: Secondary | ICD-10-CM | POA: Diagnosis not present

## 2023-11-02 ENCOUNTER — Encounter: Payer: Self-pay | Admitting: Gastroenterology

## 2024-01-18 DIAGNOSIS — G5602 Carpal tunnel syndrome, left upper limb: Secondary | ICD-10-CM | POA: Diagnosis not present

## 2024-08-17 NOTE — Progress Notes (Addendum)
 Jack Perry                                          MRN: [id number]   10/19/2024   The [facility] Quality Team Specialist reviewed this patient medical record for the purposes of chart review for care gap closure. The following were reviewed: chart review for care gap closure-controlling blood pressure.    [facility] Quality Team

## 2024-09-20 DIAGNOSIS — N400 Enlarged prostate without lower urinary tract symptoms: Secondary | ICD-10-CM | POA: Diagnosis not present

## 2024-11-14 NOTE — Progress Notes (Unsigned)
 " [facility] [phone]   [phone]  INITIAL CONSULT NOTE  Patient Care Team: Yolande Toribio MATSU, MD as PCP - General (Internal Medicine)  Hematological/Oncological History #Chronic Lymphocytic Leukemia, in observation 1) 02/07/2021: WBC 10.7, Hgb 16.0, MCV 95.3, Plt 172. ALC 7200 2) 12/04/2020: Lab work from PCP revealed WBC 14.97 K/uL, lymphocytes 11.9 K/uL, lymphocyte % 79.3 3) 12/24/2020: Establish care with Dr. Federico -Flow Cytometry: Monoclonal B-cell population consistent with chronic lymphocytic leukemia. FISH: del(13q) identified. 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  4) 09/09/2021: patient lost to follow up 5) 11/15/2024: re-establish care with Dr. Federico   CHIEF COMPLAINTS/PURPOSE OF CONSULTATION:  CLL   HISTORY OF PRESENTING ILLNESS:  Jack Perry 74 y.o. male with medical history significant for Peyronie's disease, hypertension, hyperlipidemia and CLL who presents to reestablish care.  On review of the previous records Jack Perry was previously seen by us  on 12/24/2020 when he establish care for diagnosis of CLL.  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 his last visit with us  was on 09/09/2021, after which time he was lost to follow-up.  He presents today to reestablish care.  In the interim since our last visit he has had no major changes in his health.  On exam today Jack Perry reports he has been feeling well overall in the interim since our last visit.  He reports his energy and appetite are strong.  He had no hospitalizations or ER visits but has had carpal tunnel surgeries last year and also had a gallbladder removal.  He reports he does not have any bumps or lumps concerning for lymphadenopathy.  He said no issues with fevers, chills, sweats.  He has not received a flu shot since 1970s.  He reports he has not started any new medications.  He notes that he remains a non-smoker and does not drink any alcohol .  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  He notes that he does his best to try to walk a mile a day and eats healthy foods, avoiding fried foods.  Overall his health has been steady and he has no additional questions concerns or complaints today.  Full 10 point ROS is otherwise negative.  MEDICAL HISTORY:  Past Medical History:  Diagnosis Date   Erectile dysfunction    Hyperlipidemia    Hypertension    Peyronie's disease    Right knee pain     SURGICAL HISTORY: Past Surgical History:  Procedure Laterality Date   APPENDECTOMY  1970's   CARPAL TUNNEL RELEASE Right 12/12/2020   CHOLECYSTECTOMY N/A 02/09/2019   Procedure: LAPAROSCOPIC CHOLECYSTECTOMY WITH INTRAOPERATIVE CHOLANGIOGRAM;  Surgeon: Eletha Boas, MD;  Location: [facility];  Service: General;  Laterality: N/A;    SOCIAL HISTORY: Social History   Socioeconomic History   Marital status: Single    Spouse name: Not on file   Number of children: Not on file   Years of education: Not on file   Highest education level: Not on file  Occupational History   Occupation: Self employed    Comment: Heating and air  Tobacco Use   Smoking status: Former   Smokeless tobacco: Never   Tobacco comments:    Stopped smoking in 1973  Substance and Sexual Activity   Alcohol  use: Not Currently    Comment: Stopped drinking over 14 years   Drug use: Never   Sexual activity: Not on file  Other Topics Concern   Not on file  Social History Narrative   Not on file   Social Drivers of Health   Tobacco Use: Not on file  Financial Resource Strain: Not on file  Food Insecurity: No Food Insecurity (11/15/2024)   Epic    Worried About Programme Researcher, Broadcasting/film/video in the Last Year: Never true    Ran Out of Food in the Last Year: Never true  Transportation Needs: No Transportation Needs (11/15/2024)   Epic    Lack of Transportation (Medical): No    Lack of Transportation (Non-Medical): No  Physical Activity: Not on file  Stress: Not on file  Social  Connections: Not on file  Intimate Partner Violence: Not At Risk (11/15/2024)   Epic    Fear of Current or Ex-Partner: No    Emotionally Abused: No    Physically Abused: No    Sexually Abused: No  Depression (PHQ2-9): Low Risk (11/15/2024)   Depression (PHQ2-9)    PHQ-2 Score: 0  Alcohol  Screen: Not on file  Housing: Unknown (11/15/2024)   Epic    Unable to Pay for Housing in the Last Year: No    Number of Times Moved in the Last Year: Not on file    Homeless in the Last Year: No  Utilities: Not At Risk (11/15/2024)   Epic    Threatened with loss of utilities: No  Health Literacy: Not on file    FAMILY HISTORY: Family History  Problem Relation Age of Onset   Lung cancer Father    Healthy Sister    Heart disease Brother     ALLERGIES:  has no known allergies.  MEDICATIONS:  Current Outpatient Medications  Medication Sig Dispense Refill   amLODipine-olmesartan (AZOR) 5-20 MG tablet Take 1 tablet by mouth daily.     gabapentin (NEURONTIN) 300 MG capsule Take by mouth.     rosuvastatin (CRESTOR) 10 MG tablet Take 10 mg by mouth daily.     sildenafil (VIAGRA) 100 MG tablet Take 100 mg by mouth daily as needed.     No current facility-administered medications for this visit.    REVIEW OF SYSTEMS:   Constitutional: ( - ) fevers, ( - )  chills , ( - ) night sweats Eyes: ( - ) blurriness of vision, ( - ) double vision, ( - ) watery eyes Ears, nose, mouth, throat, and face: ( - ) mucositis, ( - ) sore throat Respiratory: ( - ) cough, ( - ) dyspnea, ( - ) wheezes Cardiovascular: ( - ) palpitation, ( - ) chest discomfort, ( - ) lower extremity swelling Gastrointestinal:  ( - ) nausea, ( - ) heartburn, ( - ) change in bowel habits Skin: ( - ) abnormal skin rashes Lymphatics: ( - ) new lymphadenopathy, ( - ) easy bruising Neurological: ( - ) numbness, ( - ) tingling, ( - ) new weaknesses Behavioral/Psych: ( - ) mood change, ( - ) new changes  All other systems were reviewed with  the patient and are negative.  PHYSICAL EXAMINATION:  Vitals:   11/15/24 0856  BP: (!) 142/79  Pulse: 64  Resp: 18  Temp: 98.1 F (36.7 C)  SpO2: 100%   Filed Weights   11/15/24 0856  Weight: 256 lb 12.8 oz (116.5 kg)    GENERAL: well appearing elderly African-American male in NAD  SKIN: skin color, texture, turgor are normal, no rashes or significant lesions EYES: conjunctiva are pink and non-injected, sclera clear LUNGS: clear to auscultation and percussion with normal breathing effort HEART:  regular rate & rhythm and no murmurs and no lower extremity edema Musculoskeletal: no cyanosis of digits and no clubbing  PSYCH: alert & oriented x 3, fluent speech NEURO: no focal motor/sensory deficits  LABORATORY DATA:  I have reviewed the data as listed    Latest Ref Rng & Units 11/15/2024    9:56 AM 09/09/2021   10:19 AM 04/16/2021    9:19 AM  CBC  WBC 4.0 - 10.5 K/uL 20.1  14.8  13.6   Hemoglobin 13.0 - 17.0 g/dL 84.1  85.5  84.9   Hematocrit 39.0 - 52.0 % 48.5  44.3  45.2   Platelets 150 - 400 K/uL 120  130  116        Latest Ref Rng & Units 09/09/2021   10:19 AM 04/16/2021    9:19 AM 01/14/2021    2:43 PM  CMP  Glucose 70 - 99 mg/dL 83  97  851   BUN 8 - 23 mg/dL 19  22  23    Creatinine 0.61 - 1.24 mg/dL 8.96  8.93  8.94   Sodium 135 - 145 mmol/L 140  139  140   Potassium 3.5 - 5.1 mmol/L 3.9  4.0  3.6   Chloride 98 - 111 mmol/L 107  104  104   CO2 22 - 32 mmol/L 28  28  27    Calcium 8.9 - 10.3 mg/dL 9.0  9.3  9.0   Total Protein 6.5 - 8.1 g/dL 6.5  6.6  6.9   Total Bilirubin 0.3 - 1.2 mg/dL 0.6  0.5  0.8   Alkaline Phos 38 - 126 U/L 66  67  62   AST 15 - 41 U/L 25  22  59   ALT 0 - 44 U/L 18  13  21       ASSESSMENT & PLAN Jack Perry is a 74 y.o. male presents to re-establish care for a diagnosis of CLL. Reviewed FISH results that indicate a del(13q) which is associated with a favorable prognostic outcome.  Recommendation is to proceed with close  observation and return to the clinic in 6 months with repeat labs.Additional he was found to have mutated IGHV and negative ZAP 70 mutational status, both good prognostic markers.    #Chronic Lymphocytic Leukemia: #Thrombocytopenia #Lymphocytosis --Flow Cytometry: Monoclonal B-cell population consistent with chronic lymphocytic leukemia.  --FISH: del(13q) identified, Positive for IgHV mutation and negative ZAP 70  PLAN:  --Labs from today shows stable WBC 20.1, Hgb 15.8, MCV 95.1, Plt 120  --At time of progression consider Zanubrutinib (favored) versus venetoclax plus obinutuzumab  --Plan to proceed with close observation. Indications for treatment noted below  --RTC in 6 months with labs    Orders Placed This Encounter  Procedures   CBC with Differential (Cancer Center Only)    Standing Status:   Future    Number of Occurrences:   1    Expiration Date:   11/15/2025   CMP (Cancer Center only)    Standing Status:   Future    Number of Occurrences:   1    Expiration Date:   11/15/2025   Lactate dehydrogenase (LDH)    Standing Status:   Future    Number of Occurrences:   1    Expiration Date:   11/15/2025    All questions were answered. The patient knows to call the clinic with any problems, questions or concerns.  A total of more than 40 minutes were spent on this encounter with face-to-face time  and non-face-to-face time, including preparing to see the patient, ordering tests and/or medications, counseling the patient and coordination of care as outlined above.   Norleen IVAR Kidney, MD Department of Hematology/Oncology [facility] Phone: [phone] Pager: [phone] Email: [email or web address]  11/15/2024 10:22 AM  Shermon CHRISTELLA Hearing BD, Catovsky D, Caligaris-Cappio F, Dighiero G, Dhner H, Hillmen P, Keating M, Montserrat E, Chiorazzi N, Stilgenbauer S, Rai KR, Dibble, Eichhorst B, O'Brien S, Robak T, Seymour JF, Kipps TJ. iwCLL  guidelines for diagnosis, indications for treatment, response assessment, and supportive management of CLL. Blood. 2018 Jun 21;131(25):2745-2760.  Active disease should be clearly documented to initiate therapy. At least 1 of the following criteria should be met.  1) Evidence of progressive marrow failure as manifested by the development of, or worsening of, anemia and/or thrombocytopenia. Cutoff levels of Hb <10 g/dL or platelet counts <899  109/L are generally regarded as indication for treatment. However, in some patients, platelet counts <100  109/L may remain stable over a long period; this situation does not automatically require therapeutic intervention. 2) Massive (ie, >=6 cm below the left costal margin) or progressive or symptomatic splenomegaly. 3) Massive nodes (ie, >=10 cm in longest diameter) or progressive or symptomatic lymphadenopathy. 4) Progressive lymphocytosis with an increase of >=50% over a 35-month period, or lymphocyte doubling time (LDT) <6 months. LDT can be obtained by linear regression extrapolation of absolute lymphocyte counts obtained at intervals of 2 weeks over an observation period of 2 to 3 months; patients with initial blood lymphocyte counts <30  109/L may require a longer observation period to determine the LDT. Factors contributing to lymphocytosis other than CLL (eg, infections, steroid administration) should be excluded. 5) Autoimmune complications including anemia or thrombocytopenia poorly responsive to corticosteroids. 6) Symptomatic or functional extranodal involvement (eg, skin, kidney, lung, spine). Disease-related symptoms as defined by any of the following: Unintentional weight loss >=10% within the previous 6 months. Significant fatigue (ie, ECOG performance scale 2 or worse; cannot work or unable to perform usual activities). Fevers >=100.74F or 38.0C for 2 or more weeks without evidence of infection. Night sweats for >=1 month without evidence of  infection.   "

## 2024-11-15 ENCOUNTER — Inpatient Hospital Stay: Attending: Hematology and Oncology | Admitting: Hematology and Oncology

## 2024-11-15 ENCOUNTER — Inpatient Hospital Stay

## 2024-11-15 VITALS — BP 142/79 | HR 64 | Temp 98.1°F | Resp 18 | Ht 75.0 in | Wt 256.8 lb

## 2024-11-15 DIAGNOSIS — C9110 Chronic lymphocytic leukemia of B-cell type not having achieved remission: Secondary | ICD-10-CM | POA: Diagnosis present

## 2024-11-15 DIAGNOSIS — D696 Thrombocytopenia, unspecified: Secondary | ICD-10-CM | POA: Insufficient documentation

## 2024-11-15 LAB — CBC WITH DIFFERENTIAL (CANCER CENTER ONLY)
Abs Immature Granulocytes: 0.02 10*3/uL (ref 0.00–0.07)
Basophils Absolute: 0.1 10*3/uL (ref 0.0–0.1)
Basophils Relative: 0 %
Eosinophils Absolute: 0.1 10*3/uL (ref 0.0–0.5)
Eosinophils Relative: 0 %
HCT: 48.5 % (ref 39.0–52.0)
Hemoglobin: 15.8 g/dL (ref 13.0–17.0)
Immature Granulocytes: 0 %
Lymphocytes Relative: 86 %
Lymphs Abs: 17 10*3/uL — ABNORMAL HIGH (ref 0.7–4.0)
MCH: 31 pg (ref 26.0–34.0)
MCHC: 32.6 g/dL (ref 30.0–36.0)
MCV: 95.1 fL (ref 80.0–100.0)
Monocytes Absolute: 0.5 10*3/uL (ref 0.1–1.0)
Monocytes Relative: 2 %
Neutro Abs: 2.5 10*3/uL (ref 1.7–7.7)
Neutrophils Relative %: 12 %
Platelet Count: 120 10*3/uL — ABNORMAL LOW (ref 150–400)
RBC: 5.1 MIL/uL (ref 4.22–5.81)
RDW: 13.6 % (ref 11.5–15.5)
Smear Review: NORMAL
WBC Count: 20.1 10*3/uL — ABNORMAL HIGH (ref 4.0–10.5)
nRBC: 0.1 % (ref 0.0–0.2)

## 2024-11-15 LAB — CMP (CANCER CENTER ONLY)
ALT: 16 U/L (ref 0–44)
AST: 30 U/L (ref 15–41)
Albumin: 4.4 g/dL (ref 3.5–5.0)
Alkaline Phosphatase: 74 U/L (ref 38–126)
Anion gap: 10 (ref 5–15)
BUN: 18 mg/dL (ref 8–23)
CO2: 26 mmol/L (ref 22–32)
Calcium: 9.7 mg/dL (ref 8.9–10.3)
Chloride: 103 mmol/L (ref 98–111)
Creatinine: 1.1 mg/dL (ref 0.61–1.24)
GFR, Estimated: >60 mL/min
Glucose, Bld: 97 mg/dL (ref 70–99)
Potassium: 4.2 mmol/L (ref 3.5–5.1)
Sodium: 139 mmol/L (ref 135–145)
Total Bilirubin: 0.5 mg/dL (ref 0.0–1.2)
Total Protein: 6.9 g/dL (ref 6.5–8.1)

## 2024-11-15 LAB — LACTATE DEHYDROGENASE: LDH: 177 U/L (ref 105–235)

## 2025-05-15 ENCOUNTER — Inpatient Hospital Stay

## 2025-05-15 ENCOUNTER — Inpatient Hospital Stay: Admitting: Hematology and Oncology
